# Patient Record
Sex: Female | Born: 1950 | Race: Black or African American | Hispanic: No | Marital: Single | Smoking: Former smoker
Health system: Southern US, Community
[De-identification: ages and names within clinical notes are randomized; demographics above are authoritative.]

## PROBLEM LIST (undated history)

## (undated) DIAGNOSIS — I10 Essential (primary) hypertension: Secondary | ICD-10-CM

## (undated) DIAGNOSIS — R220 Localized swelling, mass and lump, head: Secondary | ICD-10-CM

## (undated) DIAGNOSIS — L309 Dermatitis, unspecified: Secondary | ICD-10-CM

## (undated) DIAGNOSIS — I8390 Asymptomatic varicose veins of unspecified lower extremity: Secondary | ICD-10-CM

## (undated) DIAGNOSIS — R918 Other nonspecific abnormal finding of lung field: Secondary | ICD-10-CM

## (undated) HISTORY — DX: Asymptomatic varicose veins of unspecified lower extremity: I83.90

## (undated) HISTORY — PX: TUBAL LIGATION: SHX77

## (undated) HISTORY — DX: Other nonspecific abnormal finding of lung field: R91.8

## (undated) HISTORY — DX: Essential (primary) hypertension: I10

## (undated) HISTORY — DX: Localized swelling, mass and lump, head: R22.0

---

## 2001-07-02 ENCOUNTER — Ambulatory Visit (HOSPITAL_COMMUNITY): Admission: RE | Admit: 2001-07-02 | Discharge: 2001-07-02 | Payer: Self-pay | Admitting: Internal Medicine

## 2001-07-02 ENCOUNTER — Encounter: Payer: Self-pay | Admitting: Internal Medicine

## 2002-10-29 HISTORY — PX: HAMMER TOE SURGERY: SHX385

## 2003-03-03 ENCOUNTER — Ambulatory Visit (HOSPITAL_COMMUNITY): Admission: RE | Admit: 2003-03-03 | Discharge: 2003-03-03 | Payer: Self-pay | Admitting: Podiatry

## 2004-02-07 ENCOUNTER — Ambulatory Visit (HOSPITAL_COMMUNITY): Admission: RE | Admit: 2004-02-07 | Discharge: 2004-02-07 | Payer: Self-pay | Admitting: Internal Medicine

## 2005-05-11 ENCOUNTER — Ambulatory Visit (HOSPITAL_COMMUNITY): Admission: RE | Admit: 2005-05-11 | Discharge: 2005-05-11 | Payer: Self-pay | Admitting: Internal Medicine

## 2006-05-20 ENCOUNTER — Ambulatory Visit (HOSPITAL_COMMUNITY): Admission: RE | Admit: 2006-05-20 | Discharge: 2006-05-20 | Payer: Self-pay | Admitting: Internal Medicine

## 2006-11-17 ENCOUNTER — Emergency Department (HOSPITAL_COMMUNITY): Admission: EM | Admit: 2006-11-17 | Discharge: 2006-11-17 | Payer: Self-pay | Admitting: Emergency Medicine

## 2008-12-29 ENCOUNTER — Ambulatory Visit (HOSPITAL_COMMUNITY): Admission: RE | Admit: 2008-12-29 | Discharge: 2008-12-29 | Payer: Self-pay | Admitting: Internal Medicine

## 2010-02-06 ENCOUNTER — Ambulatory Visit (HOSPITAL_COMMUNITY): Admission: RE | Admit: 2010-02-06 | Discharge: 2010-02-06 | Payer: Self-pay | Admitting: Internal Medicine

## 2010-11-19 ENCOUNTER — Encounter: Payer: Self-pay | Admitting: Internal Medicine

## 2011-03-16 NOTE — H&P (Signed)
NAME:  Lisa Terrell, Lisa Terrell NO.:  [id number]   MEDICAL RECORD NO.:  [id number]                  PATIENT TYPE:   LOCATION:                                       FACILITY:   PHYSICIAN:  Denny Peon. Ulice Brilliant, D.P.M.               DATE OF BIRTH:   DATE OF ADMISSION:  03/03/2003  DATE OF DISCHARGE:                                HISTORY & PHYSICAL   HISTORY OF PRESENT ILLNESS:  The patient is a 60 year old, African-American  female who has a very painful hyperkeratotic lesion on the dorsal lateral  aspect of the fifth toe of her right foot.  This lesion has been present for  several years, but over the last several months it has gotten extremely  painful.  The patient relates that she has tried altering all different  types of shoes and it just does not seem like it goes away.   PAST MEDICAL HISTORY:  Unremarkable.  She has had one child.   MEDICATIONS:  No prescription medications at this point.   ALLERGIES:  No known drug allergies.   PAST SURGICAL HISTORY:  None.   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  She is a half pack per day smoker.  She denies use of alcohol.   PHYSICAL EXAMINATION:  She has a well-defined, hammer toe deformity of the  fifth digit of her right foot.  Radiographs reveal a fairly large prominence  on the lateral aspect of the proximal phalanx as well as an adductovarus  hammer toe rotation.   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   PLAN:  The patient has been treated conservatively with debridement.  She  relates that it helped for a period of time, but she is getting weary of  this and would like to have this corrected.  This will consist of a  derotational arthroplasty.  She has elected to do this under local  anesthesia.  We will do it at [facility] on Mar 03, 2003.  She has  read the consent form, apparently understood  and signed.  We have discussed  the procedure, the necessity of removing some bone from the toe and some  tendon, the usual postoperative course, the type of suture and closure that  we will use, two weeks in a dressing unable to get it wet for that two week  period.  She will likely be out of regular shoes for about five to six  weeks.  As stated, she has read this consent form and participated in the  discussion, apparently understood and signed.  Denny Peon. Ulice Brilliant, D.P.M.    CMD/MEDQ  D:  03/01/2003  T:  03/01/2003  Job:  [id number]

## 2011-03-16 NOTE — Op Note (Signed)
NAME:  Lisa Terrell, ZIESMER                         ACCOUNT NO.:  [id number]   MEDICAL RECORD NO.:  [id number]                   PATIENT TYPE:  AMB   LOCATION:  DAY                                  FACILITY:  [facility]   PHYSICIAN:  Cody M. Ulice Brilliant, D.P.M.               DATE OF BIRTH:  01/17/1951   DATE OF PROCEDURE:  03/03/2003  DATE OF DISCHARGE:                                 OPERATIVE REPORT   PREOPERATIVE DIAGNOSES:  Hammer toe deformity fifth toe of right foot.   POSTOPERATIVE DIAGNOSES:  Hammer toe deformity fifth toe of right foot.   PROCEDURE:  Derotation arthroplasty fifth digit right foot.   SURGEON:  Denny Peon. Ulice Brilliant, DPM   ANESTHESIA:  Local.   INDICATIONS FOR SURGERY:  A 60 year old black female with painful,  longstanding hammer digit of fifth toe with corresponding hyperkeratotic  lesion.  The patient has had problems with this for years, but recently it  has gotten worse and she has requested surgical correction.  Clinically the  toe lies in a position of adduction and varus.  Radiographically the  proximal phalangeal head is hypertrophic about the lateral aspect.   DESCRIPTION OF PROCEDURE:  The patient is brought into the OR and placed on  the table in the supine position.  Once positioned on the table, ethyl  chloride is utilized to topically refrigerate the skin and then a digital  block is performed about the fifth toe with a 1:1 mixture of lidocaine and  Marcaine.  A pneumatic ankle tourniquet is then applied across her right  ankle.  Her foot is then prepped and draped in the usual aseptic fashion.  An ACE bandage is then utilized to exsanguinate her foot.  The tourniquet is  inflated to 250 mmHg.   Procedure #1: DEROTATION ARTHROPLASTY FIFTH DIGIT OF RIGHT FOOT.  Attention  is directed to the this right fifth toe.  Two semi-elliptical, curvilinear,  skin incisions are planned with a skin marking pen; and these are then  carried out utilizing a fresh #15 skin  blade.  The resultant elliptical  wedge of skin is excised in toto.  This contains the hyperkeratotic lesion.  The skin edges are then undermined.  A proximal interphalangeal joint is  then entered by a dorsal transverse incision across the extensor tendon.  The medial and lateral collateral ligaments are isolate and severed.  The  proximal phalangeal head is then delivered into the surgical wound and  excised with the double-action bone forceps.  The newly formed surface is  inspected and any spicules that may be encountered are removed with the bone  rongeur.  The wound is flushed with copious amounts of irrigant.  Redundant  soft tissue is excised.  The skin edges are then reapproximated and closed  with a combination of simple interrupted and horizontal mattress sutures of  4-0 Prolene.   A postoperative injection of Hexadrol and  Marcaine is dispensed.  A Betadine  soaked Adaptic dressing and a dry sterile compressive dressing follows.  The  tourniquet is then deflated with tourniquet time extending 19 minutes.   The patient tolerates the anesthesia and procedure well.  She is transported  to day hospital without incident.  While there, a list of written  instructions are orally explained to her.  A first postoperative appointment  has been requested for one week.  She will be seen within a week for this  first postoperative visit.                                               Denny Peon. Ulice Brilliant, D.P.M.    CMD/MEDQ  D:  03/03/2003  T:  03/03/2003  Job:  [id number]

## 2011-03-19 ENCOUNTER — Other Ambulatory Visit (HOSPITAL_COMMUNITY): Payer: Self-pay | Admitting: Internal Medicine

## 2011-03-19 DIAGNOSIS — Z139 Encounter for screening, unspecified: Secondary | ICD-10-CM

## 2011-03-20 ENCOUNTER — Ambulatory Visit (HOSPITAL_COMMUNITY)
Admission: RE | Admit: 2011-03-20 | Discharge: 2011-03-20 | Disposition: A | Discharge status: Home / Self Care | Payer: 59 | Source: Ambulatory Visit | Attending: Internal Medicine | Admitting: Internal Medicine

## 2011-03-20 DIAGNOSIS — Z139 Encounter for screening, unspecified: Secondary | ICD-10-CM

## 2011-03-20 DIAGNOSIS — Z1231 Encounter for screening mammogram for malignant neoplasm of breast: Secondary | ICD-10-CM | POA: Insufficient documentation

## 2012-02-20 ENCOUNTER — Other Ambulatory Visit (HOSPITAL_COMMUNITY): Payer: Self-pay | Admitting: Internal Medicine

## 2012-02-20 DIAGNOSIS — Z139 Encounter for screening, unspecified: Secondary | ICD-10-CM

## 2012-03-20 ENCOUNTER — Ambulatory Visit (HOSPITAL_COMMUNITY)
Admission: RE | Admit: 2012-03-20 | Discharge: 2012-03-20 | Disposition: A | Discharge status: Home / Self Care | Payer: 59 | Source: Ambulatory Visit | Attending: Internal Medicine | Admitting: Internal Medicine

## 2012-03-20 DIAGNOSIS — Z1231 Encounter for screening mammogram for malignant neoplasm of breast: Secondary | ICD-10-CM | POA: Insufficient documentation

## 2012-03-20 DIAGNOSIS — Z139 Encounter for screening, unspecified: Secondary | ICD-10-CM

## 2012-03-24 ENCOUNTER — Ambulatory Visit (HOSPITAL_COMMUNITY): Payer: 59

## 2013-09-09 ENCOUNTER — Other Ambulatory Visit (HOSPITAL_COMMUNITY): Payer: Self-pay | Admitting: General Surgery

## 2013-09-09 DIAGNOSIS — R221 Localized swelling, mass and lump, neck: Secondary | ICD-10-CM

## 2013-09-16 ENCOUNTER — Ambulatory Visit (HOSPITAL_COMMUNITY)
Admission: RE | Admit: 2013-09-16 | Discharge: 2013-09-16 | Disposition: A | Discharge status: Home / Self Care | Payer: 59 | Source: Ambulatory Visit | Attending: General Surgery | Admitting: General Surgery

## 2013-09-16 ENCOUNTER — Encounter (HOSPITAL_COMMUNITY): Payer: Self-pay

## 2013-09-16 ENCOUNTER — Other Ambulatory Visit (HOSPITAL_COMMUNITY): Payer: Self-pay | Admitting: General Surgery

## 2013-09-16 DIAGNOSIS — F17200 Nicotine dependence, unspecified, uncomplicated: Secondary | ICD-10-CM | POA: Insufficient documentation

## 2013-09-16 DIAGNOSIS — R220 Localized swelling, mass and lump, head: Secondary | ICD-10-CM | POA: Insufficient documentation

## 2013-09-16 DIAGNOSIS — R599 Enlarged lymph nodes, unspecified: Secondary | ICD-10-CM | POA: Insufficient documentation

## 2013-09-16 DIAGNOSIS — R221 Localized swelling, mass and lump, neck: Secondary | ICD-10-CM

## 2013-09-16 DIAGNOSIS — K119 Disease of salivary gland, unspecified: Secondary | ICD-10-CM | POA: Insufficient documentation

## 2013-09-16 DIAGNOSIS — R222 Localized swelling, mass and lump, trunk: Secondary | ICD-10-CM | POA: Insufficient documentation

## 2013-09-16 MED ORDER — GADOBENATE DIMEGLUMINE 529 MG/ML IV SOLN
15.0000 mL | Freq: Once | INTRAVENOUS | Status: AC | PRN
  Administered 2013-09-16: 15 mL via INTRAVENOUS

## 2013-10-02 DIAGNOSIS — R220 Localized swelling, mass and lump, head: Secondary | ICD-10-CM

## 2013-10-02 HISTORY — DX: Localized swelling, mass and lump, head: R22.0

## 2013-10-07 ENCOUNTER — Other Ambulatory Visit (INDEPENDENT_AMBULATORY_CARE_PROVIDER_SITE_OTHER): Payer: Self-pay | Admitting: Otolaryngology

## 2013-10-07 DIAGNOSIS — R918 Other nonspecific abnormal finding of lung field: Secondary | ICD-10-CM

## 2013-10-15 ENCOUNTER — Ambulatory Visit (INDEPENDENT_AMBULATORY_CARE_PROVIDER_SITE_OTHER): Payer: 59 | Admitting: Otolaryngology

## 2013-10-15 ENCOUNTER — Ambulatory Visit (HOSPITAL_COMMUNITY)
Admission: RE | Admit: 2013-10-15 | Discharge: 2013-10-15 | Disposition: A | Discharge status: Home / Self Care | Payer: 59 | Source: Ambulatory Visit | Attending: Otolaryngology | Admitting: Otolaryngology

## 2013-10-15 DIAGNOSIS — R918 Other nonspecific abnormal finding of lung field: Secondary | ICD-10-CM

## 2013-10-28 ENCOUNTER — Encounter (HOSPITAL_COMMUNITY): Payer: 59

## 2013-11-04 ENCOUNTER — Ambulatory Visit (HOSPITAL_COMMUNITY)
Admission: RE | Admit: 2013-11-04 | Discharge: 2013-11-04 | Disposition: A | Discharge status: Home / Self Care | Payer: 59 | Source: Ambulatory Visit | Attending: Otolaryngology | Admitting: Otolaryngology

## 2013-11-04 DIAGNOSIS — R222 Localized swelling, mass and lump, trunk: Secondary | ICD-10-CM | POA: Insufficient documentation

## 2013-11-04 DIAGNOSIS — I700 Atherosclerosis of aorta: Secondary | ICD-10-CM | POA: Insufficient documentation

## 2013-11-04 DIAGNOSIS — R918 Other nonspecific abnormal finding of lung field: Secondary | ICD-10-CM | POA: Insufficient documentation

## 2013-11-04 DIAGNOSIS — K5730 Diverticulosis of large intestine without perforation or abscess without bleeding: Secondary | ICD-10-CM | POA: Insufficient documentation

## 2013-11-04 LAB — GLUCOSE, CAPILLARY: Glucose-Capillary: 80 mg/dL (ref 70–99)

## 2013-11-04 MED ORDER — FLUDEOXYGLUCOSE F - 18 (FDG) INJECTION
16.2000 | Freq: Once | INTRAVENOUS | Status: AC | PRN
  Administered 2013-11-04: 16.2 via INTRAVENOUS

## 2013-11-11 ENCOUNTER — Encounter: Payer: Self-pay | Admitting: *Deleted

## 2013-11-11 DIAGNOSIS — R918 Other nonspecific abnormal finding of lung field: Secondary | ICD-10-CM | POA: Insufficient documentation

## 2013-11-11 DIAGNOSIS — C3410 Malignant neoplasm of upper lobe, unspecified bronchus or lung: Secondary | ICD-10-CM | POA: Insufficient documentation

## 2013-11-11 DIAGNOSIS — I10 Essential (primary) hypertension: Secondary | ICD-10-CM | POA: Insufficient documentation

## 2013-11-12 ENCOUNTER — Institutional Professional Consult (permissible substitution) (INDEPENDENT_AMBULATORY_CARE_PROVIDER_SITE_OTHER): Payer: 59 | Admitting: Thoracic Surgery (Cardiothoracic Vascular Surgery)

## 2013-11-12 ENCOUNTER — Other Ambulatory Visit (HOSPITAL_COMMUNITY)
Admission: RE | Admit: 2013-11-12 | Discharge: 2013-11-12 | Disposition: A | Discharge status: Home / Self Care | Payer: 59 | Source: Ambulatory Visit | Attending: Otolaryngology | Admitting: Otolaryngology

## 2013-11-12 ENCOUNTER — Ambulatory Visit (INDEPENDENT_AMBULATORY_CARE_PROVIDER_SITE_OTHER): Payer: 59 | Admitting: Otolaryngology

## 2013-11-12 ENCOUNTER — Other Ambulatory Visit: Payer: Self-pay | Admitting: *Deleted

## 2013-11-12 ENCOUNTER — Encounter: Payer: Self-pay | Admitting: Thoracic Surgery (Cardiothoracic Vascular Surgery)

## 2013-11-12 VITALS — BP 140/84 | HR 65 | Resp 20 | Ht 66.0 in | Wt 172.0 lb

## 2013-11-12 DIAGNOSIS — R918 Other nonspecific abnormal finding of lung field: Secondary | ICD-10-CM

## 2013-11-12 DIAGNOSIS — D487 Neoplasm of uncertain behavior of other specified sites: Secondary | ICD-10-CM

## 2013-11-12 DIAGNOSIS — R222 Localized swelling, mass and lump, trunk: Secondary | ICD-10-CM

## 2013-11-12 DIAGNOSIS — R911 Solitary pulmonary nodule: Secondary | ICD-10-CM

## 2013-11-12 NOTE — Progress Notes (Signed)
PCP is Rosita Fire, MD Referring Provider is Rosita Fire, MD  Chief Complaint  Patient presents with  . Lung Mass    Surgical eval on left upper lobe lung mass, PET Scan 11/04/13    HPI: 63 year old woman presents with a chief complaint of a neck mass.  Lisa Terrell is a 63 year old smoker (1 pack a day for 30 years) who about 6 months ago noticed a "knot" in her neck. She wasn't having any pain, fever, chills, difficulty swallowing or shortness of breath. She didn't pay much attention to it for a while, however finally she became concerned and went to Dr. Legrand Rams. She eventually was referred to Dr. Romona Curls and then finally to Dr. Benjamine Mola. An MRI was done of the neck which showed a 2.8 cm left suprahilar lung mass with multiple level I neck nodes measuring 1.8 and 1.4 cm. There was a 1.5 cm left parotid mass. She was also noted to have a 2.8 cm suprahilar lung mass.  A PET CT was done which showed these lesions to be hypermetabolic.  She had an aspiration of a neck lymph node by Dr. Benjamine Mola today.  She continues to smoke a pack a day. She denies any chest pain, pressure, tightness, or shortness of breath. She has had a nonproductive cough but no hemoptysis. She denieswheezing. Her weight has been stable. She continues to work.  ECOG/ZUBROD= 0   Past Medical History  Diagnosis Date  . Hypertension   . Mass of submandibular region 10/02/13    TWO PALPABLE LEFT MASSES NOTED  . Mass of lung PER PET 10/29/13    LEFT UPPER LOBE  . Lung nodules 11/04/13 PER PET    MEDIAL LEFT UPPER LOBE  . Varicose veins     No past surgical history on file.  Family History  Problem Relation Age of Onset  . COPD Father   . Heart disease Father   . Hyperlipidemia Father   . Hypertension Father     Social History History  Substance Use Topics  . Smoking status: Current Every Day Smoker -- 1.00 packs/day for 30 years    Types: Cigarettes  . Smokeless tobacco: Not on file  . Alcohol Use: No    Current  Outpatient Prescriptions  Medication Sig Dispense Refill  . lisinopril-hydrochlorothiazide (PRINZIDE,ZESTORETIC) 20-25 MG per tablet Take 1 tablet by mouth daily.      . Prenatal Vit-Fe Fumarate-FA (M-VIT) tablet Take 1 tablet by mouth daily.       No current facility-administered medications for this visit.    No Known Allergies  Review of Systems  Constitutional: Negative for fever, chills, activity change, appetite change and unexpected weight change.  HENT: Negative for sore throat, trouble swallowing and voice change.        Palpable knot in neck and under chin  Respiratory: Positive for cough (Nonproductive). Negative for choking, chest tightness, shortness of breath and wheezing.   Cardiovascular: Negative for chest pain and leg swelling.       Varicose veins  All other systems reviewed and are negative.    BP 140/84  Pulse 65  Resp 20  Ht 5\' 6"  (1.676 m)  Wt 172 lb (78.019 kg)  BMI 27.77 kg/m2  SpO2 98% Physical Exam  Vitals reviewed. Constitutional: She is oriented to person, place, and time. She appears well-developed and well-nourished. No distress.  HENT:  Head: Normocephalic and atraumatic.  Eyes: EOM are normal. Pupils are equal, round, and reactive to light.  Neck: Neck supple.  No thyromegaly present.  Cardiovascular: Normal rate, regular rhythm, normal heart sounds and intact distal pulses.  Exam reveals no gallop and no friction rub.   No murmur heard. Pulmonary/Chest: Effort normal and breath sounds normal. She has no wheezes. She has no rales.  Abdominal: Soft. There is no tenderness.  Musculoskeletal: She exhibits no edema.  Lymphadenopathy:    She has cervical adenopathy (palpable left submandibular mass and  left-sided  cervical nodes, no supraclavicular adenopathy).  Neurological: She is alert and oriented to person, place, and time. No cranial nerve deficit.  No focal weakness  Skin: Skin is warm and dry.     Diagnostic Tests: CHEST 2 VIEW   COMPARISON: None.  FINDINGS:  Cardiac silhouette appears normal. 3 cm left suprahilar mass is  noted concerning for malignancy; chest CT is recommended for further  evaluation. No pneumothorax or pleural effusion is noted. Bony  thorax appears intact.  IMPRESSION:  3 cm left suprahilar mass is noted concerning for malignancy ; chest  CT scan with contrast is recommended for further evaluation.  Electronically Signed  By: Sabino Dick M.D.  On: 09/16/2013 08:19  MR NECK SOFT TISSUE ONLY WITHOUT AND WITH CONTRAST  TECHNIQUE:  Multiplanar, multisequence MR imaging was performed both before and  after administration of intravenous contrast.  CONTRAST: 54mL MULTIHANCE GADOBENATE DIMEGLUMINE 529 MG/ML IV SOLN  COMPARISON: 09/16/2013 chest x-ray.  FINDINGS:  There is a mass within the left suprahilar region highly concerning  for malignancy measuring up to 2.8 cm and incompletely assessed on  the present exam. PET CT may be considered for further delineation  (to include the head and neck region for additional evaluation of  below described findings).  Left level 1 adenopathy with lymph nodes measuring up to 1.8 x 1.8 x  1.8 cm and 1.4 x 1.2 x 1.2 cm. This raises possibility of primary  malignancy of the head and neck region. Besides the below described  left parotid mass/adenopathy, no primary neck mass is identified and  panendoscopy recommended to exclude mucosal abnormality not detected  by MR.  Anterior margin of the left parotid superficial lobe 1.5 x 1.3 x 0.9  cm lesion. Question primary of parotid mass versus intra parotid  adenopathy. ENT consultation may be considered.  Cervical spondylotic changes with spinal stenosis and cord  flattening most prominent C4-5 and less so at the C3-4 and C5-6  level.  Posterior to T1 level within the subcutaneous region there is a 1 cm  fluid appearing structure with surrounding inflammation. It is  possible this represents an inflamed  sebaceous cyst. Metastatic  deposit not excluded.  Partial opacification left mastoid air cells. No definitive mass  identified at the level of drainage of the left eustachian tube.  Visualized intracranial structures without evidence of enhancing  lesion.  Mucosal thickening maxillary sinuses.  IMPRESSION:  2.8 cm left suprahilar region highly concerning for malignancy and  incompletely assessed on the present exam. PET CT may be considered  for further delineation (to include the head and neck region for  additional evaluation of below described findings).  Left level 1 adenopathy. This raises possibility of primary  malignancy of the head and neck region. Besides the below described  left parotid mass/adenopathy, no primary neck mass is identified and  panendoscopy recommended to exclude mucosal abnormality not detected  by MR.  Prominence of the soft palate without discrete mass identified.  Anterior margin of the left parotid superficial lobe 1.5 x 1.3 x 0.9  cm lesion. Question primary of parotid mass versus intra parotid  adenopathy. ENT consultation may be considered.  Cervical spondylotic changes with spinal stenosis and cord  flattening most prominent C4-5 and less so at the C3-4 and C5-6  level.  Posterior to T1 level within the subcutaneous region there is a 1 cm  fluid appearing structure with surrounding inflammation. It is  possible this represents an inflamed sebaceous cyst. Metastatic  deposit not excluded.  Partial opacification left mastoid air cells. No definitive mass  identified at the level of drainage of the left eustachian tube.  These results were called by telephone at the time of interpretation  on 09/16/2013 at 9:33 AM to [facility], Dr. Scherry Ran nurse as  Dr. Romona Curls was in the OR, who verbally acknowledged these results.  Electronically Signed  By: Chauncey Cruel M.D.  On: 09/16/2013 09:55   NUCLEAR MEDICINE PET SKULL BASE TO THIGH  FASTING  BLOOD GLUCOSE: Value: 80 mg/dl  TECHNIQUE:  16.2 mCi F-18 FDG was injected intravenously. CT data was obtained  and used for attenuation correction and anatomic localization only.  (This was not acquired as a diagnostic CT examination.) Additional  exam technical data entered on technologist worksheet.  COMPARISON: MRI neck dated 09/16/2013  FINDINGS:  NECK  12 mm short axis left submandibular (level IB) node, max SUV 8.8  (neck PET image 63). Adjacent 17 mm short axis left level IB node  (series 2/ image 47), max SUV 13.0 (neck PET image 67).  These findings are grossly unchanged from prior MRI and remain  worrisome for nodal metastases.  CHEST  Macrolobulated 2.8 x 3.1 cm left upper lobe mass (series 2/image  70), corresponding to the prior MRI abnormality, max SUV 15.1. This  appearance is highly suspicious for primary bronchogenic neoplasm.  Two additional satellite nodules in the medial left upper lobe  measuring up to 8 mm (series 2/ images 61 and 64), max SUV 2.5.  Small mediastinal and bilateral axillary nodes measuring up to 8 mm  short axis, non FDG avid.  ABDOMEN/PELVIS  No abnormal hypermetabolic activity within the liver, pancreas,  adrenal glands, or spleen.  Atherosclerotic calcifications of the abdominal aorta and branch  vessels. Colonic diverticulosis. Left colon is mildly thick-walled  but decompressed.  8 mm short axis right inguinal node (series 2/ image 196), max SUV  6.2.  SKELETON  No focal hypermetabolic activity to suggest skeletal metastasis.  IMPRESSION:  3.1 cm left upper lobe mass, corresponding to the prior MRI  abnormality, max SUV 15.1. This appearance is highly suspicious for  primary bronchogenic neoplasm.  Two satellite nodules in the medial left upper lobe measuring up to  8 mm, max SUV 2.5, suspicious for metastases.  Two left submandibular nodes 1.7 cm short axis, max SUV 13.0. This  appearance is suspicious for nodal metastases, although  possibly  related to a nonvisualized primary head/neck cancer. ENT  consultation with visual inspection is suggested.  8 mm short axis right inguinal node, max SUV 6.2. Although the  distribution is unexpected, given the hypermetabolism, this  appearance is suspicious for nodal metastasis related to primary  lung or head/neck cancer.  Electronically Signed  By: Julian Hy M.D.  On: 11/04/2013 15:24  Impression: 63 year old smoker with a left upper lobe mass that is markedly hypermetabolic by PET CT. There are multiple other findings including satellite nodules within the left upper lobe, which would make this a T3 lesion. She also has cervical and submandibular nodes as well  as a parotid mass which are hypermetabolic. Finally there is a small right inguinal node that is hypermetabolic as well.  The first question that needs to be answered is whether we have a single metastatic lung cancer, or a lung cancer and a synchronous head and neck primary. I suspected it is the latter. Then we need to try to determine the stage and prognosis of each of those lesions.   She had a cervical node aspiration by Dr. Benjamine Mola today, we will await the results of that biopsy.  In the meantime I recommended to her that we proceed with electromagnetic navigational bronchoscopy and endobronchial ultrasound for diagnosis and staging of the lung mass. I described the procedure to her. She understands it is an endoscopic procedure to be done in the operating room under general anesthesia. We will do a guided bronchoscopic sampling of the tumor in the left upper lobe and then use EBUS to sample the mediastinal and hilar lymph nodes. We discussed the risks including those related to general anesthesia. She understands the risks of bleeding, pneumothorax, and false negative biopsies. She accepts the risks and agrees to proceed.  She will need a CT using super dimension protocol for the ENB mapping.  We will also obtain  PFTs.  Plan: ENB and EBUS on Thursday 11/19/2013

## 2013-11-17 ENCOUNTER — Ambulatory Visit (HOSPITAL_COMMUNITY)
Admission: RE | Admit: 2013-11-17 | Discharge: 2013-11-17 | Disposition: A | Discharge status: Home / Self Care | Payer: 59 | Source: Ambulatory Visit | Attending: Thoracic Surgery (Cardiothoracic Vascular Surgery) | Admitting: Thoracic Surgery (Cardiothoracic Vascular Surgery)

## 2013-11-17 DIAGNOSIS — R911 Solitary pulmonary nodule: Secondary | ICD-10-CM

## 2013-11-17 DIAGNOSIS — F17200 Nicotine dependence, unspecified, uncomplicated: Secondary | ICD-10-CM | POA: Insufficient documentation

## 2013-11-17 DIAGNOSIS — R222 Localized swelling, mass and lump, trunk: Secondary | ICD-10-CM | POA: Insufficient documentation

## 2013-11-17 DIAGNOSIS — R918 Other nonspecific abnormal finding of lung field: Secondary | ICD-10-CM | POA: Insufficient documentation

## 2013-11-17 LAB — PULMONARY FUNCTION TEST
DL/VA % pred: 108 %
DL/VA: 5.35 ml/min/mmHg/L
DLCO cor % pred: 81 %
DLCO cor: 20.81 ml/min/mmHg
DLCO unc % pred: 81 %
DLCO unc: 20.81 ml/min/mmHg
FEF 25-75 Post: 2.37 L/s
FEF 25-75 Pre: 1.71 L/s
FEF2575-%Change-Post: 38 %
FEF2575-%Pred-Post: 114 %
FEF2575-%Pred-Pre: 82 %
FEV1-%Change-Post: 8 %
FEV1-%Pred-Post: 100 %
FEV1-%Pred-Pre: 92 %
FEV1-Post: 2.13 L
FEV1-Pre: 1.96 L
FEV1FVC-%Change-Post: 1 %
FEV1FVC-%Pred-Pre: 99 %
FEV6-%Change-Post: 9 %
FEV6-%Pred-Post: 103 %
FEV6-%Pred-Pre: 94 %
FEV6-Post: 2.7 L
FEV6-Pre: 2.47 L
FEV6FVC-%Change-Post: 1 %
FEV6FVC-%Pred-Post: 104 %
FEV6FVC-%Pred-Pre: 102 %
FVC-%Change-Post: 7 %
FVC-%Pred-Post: 99 %
FVC-%Pred-Pre: 92 %
FVC-Post: 2.7 L
FVC-Pre: 2.51 L
Post FEV1/FVC ratio: 79 %
Post FEV6/FVC ratio: 100 %
Pre FEV1/FVC ratio: 78 %
Pre FEV6/FVC Ratio: 99 %
RV % pred: 116 %
RV: 2.42 L
TLC % pred: 94 %
TLC: 4.93 L

## 2013-11-17 LAB — BLOOD GAS, ARTERIAL
ACID-BASE EXCESS: 1.7 mmol/L (ref 0.0–2.0)
Bicarbonate: 25.7 mEq/L — ABNORMAL HIGH (ref 20.0–24.0)
DRAWN BY: 242311
FIO2: 0.21 %
O2 SAT: 96.9 %
PCO2 ART: 40.1 mmHg (ref 35.0–45.0)
Patient temperature: 98.6
TCO2: 27 mmol/L (ref 0–100)
pH, Arterial: 7.424 (ref 7.350–7.450)
pO2, Arterial: 85.9 mmHg (ref 80.0–100.0)

## 2013-11-17 MED ORDER — ALBUTEROL SULFATE (2.5 MG/3ML) 0.083% IN NEBU
2.5000 mg | INHALATION_SOLUTION | Freq: Once | RESPIRATORY_TRACT | Status: AC
  Administered 2013-11-17: 2.5 mg via RESPIRATORY_TRACT

## 2013-11-18 ENCOUNTER — Encounter (HOSPITAL_COMMUNITY)
Admission: RE | Admit: 2013-11-18 | Discharge: 2013-11-18 | Disposition: A | Discharge status: Home / Self Care | Payer: 59 | Source: Ambulatory Visit | Attending: Thoracic Surgery (Cardiothoracic Vascular Surgery) | Admitting: Thoracic Surgery (Cardiothoracic Vascular Surgery)

## 2013-11-18 ENCOUNTER — Encounter (HOSPITAL_COMMUNITY): Payer: Self-pay | Admitting: Pharmacy Technician

## 2013-11-18 ENCOUNTER — Ambulatory Visit (HOSPITAL_COMMUNITY)
Admission: RE | Admit: 2013-11-18 | Discharge: 2013-11-18 | Disposition: A | Discharge status: Home / Self Care | Payer: 59 | Source: Ambulatory Visit | Attending: Thoracic Surgery (Cardiothoracic Vascular Surgery) | Admitting: Thoracic Surgery (Cardiothoracic Vascular Surgery)

## 2013-11-18 ENCOUNTER — Encounter (HOSPITAL_COMMUNITY): Payer: Self-pay

## 2013-11-18 VITALS — BP 130/88 | HR 58 | Temp 98.7°F | Resp 20 | Ht 66.0 in | Wt 169.4 lb

## 2013-11-18 DIAGNOSIS — R918 Other nonspecific abnormal finding of lung field: Secondary | ICD-10-CM

## 2013-11-18 LAB — COMPREHENSIVE METABOLIC PANEL
ALT: 18 U/L (ref 0–35)
AST: 24 U/L (ref 0–37)
Albumin: 3.8 g/dL (ref 3.5–5.2)
Alkaline Phosphatase: 66 U/L (ref 39–117)
BILIRUBIN TOTAL: 0.5 mg/dL (ref 0.3–1.2)
BUN: 17 mg/dL (ref 6–23)
CHLORIDE: 104 meq/L (ref 96–112)
CO2: 24 meq/L (ref 19–32)
Calcium: 9.1 mg/dL (ref 8.4–10.5)
Creatinine, Ser: 0.98 mg/dL (ref 0.50–1.10)
GFR, EST AFRICAN AMERICAN: 70 mL/min — AB (ref >90)
GFR, EST NON AFRICAN AMERICAN: 61 mL/min — AB (ref >90)
GLUCOSE: 93 mg/dL (ref 70–99)
Potassium: 3.8 mEq/L (ref 3.7–5.3)
SODIUM: 141 meq/L (ref 137–147)
Total Protein: 7.1 g/dL (ref 6.0–8.3)

## 2013-11-18 LAB — CBC
HCT: 38.4 % (ref 36.0–46.0)
HEMOGLOBIN: 12.9 g/dL (ref 12.0–15.0)
MCH: 30.4 pg (ref 26.0–34.0)
MCHC: 33.6 g/dL (ref 30.0–36.0)
MCV: 90.6 fL (ref 78.0–100.0)
PLATELETS: 176 10*3/uL (ref 150–400)
RBC: 4.24 MIL/uL (ref 3.87–5.11)
RDW: 13.3 % (ref 11.5–15.5)
WBC: 4.1 10*3/uL (ref 4.0–10.5)

## 2013-11-18 LAB — APTT: aPTT: 29 seconds (ref 24–37)

## 2013-11-18 LAB — PROTIME-INR
INR: 1.08 (ref 0.00–1.49)
Prothrombin Time: 13.8 seconds (ref 11.6–15.2)

## 2013-11-18 NOTE — Pre-Procedure Instructions (Addendum)
DEIRDRE GRYDER  11/18/2013   Your procedure is scheduled on:  11/19/13  Report to [facility] short stay admitting at 58 AM.  Call this number if you have problems the morning of surgery: [phone]   Remember:   Do not eat food or drink liquids after midnight.   Take these medicines the morning of surgery with A SIP OF WATER:none          STOP all herbel meds, nsaids (aleve,naproxen,advil,ibuprofen) including aspirin, vitamins   Do not wear jewelry, make-up or nail polish.  Do not wear lotions, powders, or perfumes. You may wear deodorant.  Do not shave 48 hours prior to surgery. Men may shave face and neck.  Do not bring valuables to the hospital.  [facility] is not responsible                  for any belongings or valuables.               Contacts, dentures or bridgework may not be worn into surgery.  Leave suitcase in the car. After surgery it may be brought to your room.  For patients admitted to the hospital, discharge time is determined by your                treatment team.               Patients discharged the day of surgery will not be allowed to drive  home.  Name and phone number of your driver:   Special Instructions: Shower using CHG 2 nights before surgery and the night before surgery.  If you shower the day of surgery use CHG.  Use special wash - you have one bottle of CHG for all showers.  You should use approximately 1/3 of the bottle for each shower.   Please read over the following fact sheets that you were given: Pain Booklet, Coughing and Deep Breathing and Surgical Site Infection Prevention

## 2013-11-19 ENCOUNTER — Encounter (HOSPITAL_COMMUNITY): Payer: 59 | Admitting: Anesthesiology

## 2013-11-19 ENCOUNTER — Encounter (HOSPITAL_COMMUNITY)
Admission: RE | Disposition: A | Discharge status: Home / Self Care | Payer: Self-pay | Source: Ambulatory Visit | Attending: Thoracic Surgery (Cardiothoracic Vascular Surgery)

## 2013-11-19 ENCOUNTER — Ambulatory Visit (HOSPITAL_COMMUNITY): Payer: 59

## 2013-11-19 ENCOUNTER — Encounter (HOSPITAL_COMMUNITY): Payer: Self-pay | Admitting: *Deleted

## 2013-11-19 ENCOUNTER — Ambulatory Visit (HOSPITAL_COMMUNITY)
Admission: RE | Admit: 2013-11-19 | Discharge: 2013-11-19 | Disposition: A | Discharge status: Home / Self Care | Payer: 59 | Source: Ambulatory Visit | Attending: Thoracic Surgery (Cardiothoracic Vascular Surgery) | Admitting: Thoracic Surgery (Cardiothoracic Vascular Surgery)

## 2013-11-19 ENCOUNTER — Ambulatory Visit (HOSPITAL_COMMUNITY): Payer: 59 | Admitting: Anesthesiology

## 2013-11-19 DIAGNOSIS — C3410 Malignant neoplasm of upper lobe, unspecified bronchus or lung: Secondary | ICD-10-CM

## 2013-11-19 DIAGNOSIS — R918 Other nonspecific abnormal finding of lung field: Secondary | ICD-10-CM

## 2013-11-19 DIAGNOSIS — F17200 Nicotine dependence, unspecified, uncomplicated: Secondary | ICD-10-CM | POA: Insufficient documentation

## 2013-11-19 DIAGNOSIS — K119 Disease of salivary gland, unspecified: Secondary | ICD-10-CM | POA: Insufficient documentation

## 2013-11-19 DIAGNOSIS — I83893 Varicose veins of bilateral lower extremities with other complications: Secondary | ICD-10-CM | POA: Insufficient documentation

## 2013-11-19 DIAGNOSIS — I10 Essential (primary) hypertension: Secondary | ICD-10-CM | POA: Insufficient documentation

## 2013-11-19 HISTORY — PX: ENDOBRONCHIAL ULTRASOUND: SHX5096

## 2013-11-19 HISTORY — PX: VIDEO BRONCHOSCOPY WITH ENDOBRONCHIAL NAVIGATION: SHX6175

## 2013-11-19 SURGERY — VIDEO BRONCHOSCOPY WITH ENDOBRONCHIAL NAVIGATION
Anesthesia: General

## 2013-11-19 MED ORDER — NEOSTIGMINE METHYLSULFATE 1 MG/ML IJ SOLN
INTRAMUSCULAR | Status: AC
  Filled 2013-11-19: qty 10

## 2013-11-19 MED ORDER — ARTIFICIAL TEARS OP OINT
TOPICAL_OINTMENT | OPHTHALMIC | Status: AC
  Filled 2013-11-19: qty 3.5

## 2013-11-19 MED ORDER — ONDANSETRON HCL 4 MG/2ML IJ SOLN
INTRAMUSCULAR | Status: DC | PRN
  Administered 2013-11-19: 4 mg via INTRAVENOUS

## 2013-11-19 MED ORDER — FENTANYL CITRATE 0.05 MG/ML IJ SOLN
INTRAMUSCULAR | Status: DC | PRN
  Administered 2013-11-19 (×3): 50 ug via INTRAVENOUS

## 2013-11-19 MED ORDER — MIDAZOLAM HCL 5 MG/5ML IJ SOLN
INTRAMUSCULAR | Status: DC | PRN
  Administered 2013-11-19 (×2): 1 mg via INTRAVENOUS

## 2013-11-19 MED ORDER — ROCURONIUM BROMIDE 50 MG/5ML IV SOLN
INTRAVENOUS | Status: AC
  Filled 2013-11-19: qty 1

## 2013-11-19 MED ORDER — PROPOFOL 10 MG/ML IV BOLUS
INTRAVENOUS | Status: DC | PRN
  Administered 2013-11-19: 100 mg via INTRAVENOUS

## 2013-11-19 MED ORDER — LIDOCAINE HCL (CARDIAC) 20 MG/ML IV SOLN
INTRAVENOUS | Status: AC
  Filled 2013-11-19: qty 5

## 2013-11-19 MED ORDER — FENTANYL CITRATE 0.05 MG/ML IJ SOLN: 25.0000 ug | INTRAMUSCULAR | Status: DC | PRN

## 2013-11-19 MED ORDER — 0.9 % SODIUM CHLORIDE (POUR BTL) OPTIME
TOPICAL | Status: DC | PRN
  Administered 2013-11-19: 1000 mL

## 2013-11-19 MED ORDER — FENTANYL CITRATE 0.05 MG/ML IJ SOLN
INTRAMUSCULAR | Status: AC
  Filled 2013-11-19: qty 5

## 2013-11-19 MED ORDER — OXYCODONE HCL 5 MG/5ML PO SOLN: 5.0000 mg | Freq: Once | ORAL | Status: DC | PRN

## 2013-11-19 MED ORDER — PROPOFOL 10 MG/ML IV BOLUS
INTRAVENOUS | Status: AC
  Filled 2013-11-19: qty 20

## 2013-11-19 MED ORDER — MEPERIDINE HCL 25 MG/ML IJ SOLN: 6.2500 mg | INTRAMUSCULAR | Status: DC | PRN

## 2013-11-19 MED ORDER — ROCURONIUM BROMIDE 100 MG/10ML IV SOLN
INTRAVENOUS | Status: DC | PRN
  Administered 2013-11-19 (×2): 10 mg via INTRAVENOUS
  Administered 2013-11-19: 40 mg via INTRAVENOUS

## 2013-11-19 MED ORDER — EPINEPHRINE HCL 1 MG/ML IJ SOLN
INTRAMUSCULAR | Status: AC
  Filled 2013-11-19: qty 1

## 2013-11-19 MED ORDER — LACTATED RINGERS IV SOLN
INTRAVENOUS | Status: DC | PRN
  Administered 2013-11-19: 11:00:00 via INTRAVENOUS

## 2013-11-19 MED ORDER — MIDAZOLAM HCL 2 MG/2ML IJ SOLN: 0.5000 mg | Freq: Once | INTRAMUSCULAR | Status: DC | PRN

## 2013-11-19 MED ORDER — MIDAZOLAM HCL 2 MG/2ML IJ SOLN
INTRAMUSCULAR | Status: AC
  Filled 2013-11-19: qty 2

## 2013-11-19 MED ORDER — LACTATED RINGERS IV SOLN
INTRAVENOUS | Status: DC
  Administered 2013-11-19: 07:00:00 via INTRAVENOUS

## 2013-11-19 MED ORDER — OXYCODONE HCL 5 MG PO TABS: 5.0000 mg | ORAL_TABLET | Freq: Once | ORAL | Status: DC | PRN

## 2013-11-19 MED ORDER — ARTIFICIAL TEARS OP OINT
TOPICAL_OINTMENT | OPHTHALMIC | Status: DC | PRN
  Administered 2013-11-19: 1 via OPHTHALMIC

## 2013-11-19 MED ORDER — GLYCOPYRROLATE 0.2 MG/ML IJ SOLN
INTRAMUSCULAR | Status: DC | PRN
  Administered 2013-11-19: 0.6 mg via INTRAVENOUS

## 2013-11-19 MED ORDER — NEOSTIGMINE METHYLSULFATE 1 MG/ML IJ SOLN
INTRAMUSCULAR | Status: DC | PRN
  Administered 2013-11-19: 4 mg via INTRAVENOUS

## 2013-11-19 MED ORDER — LIDOCAINE HCL (CARDIAC) 20 MG/ML IV SOLN
INTRAVENOUS | Status: DC | PRN
  Administered 2013-11-19: 20 mg via INTRAVENOUS

## 2013-11-19 MED ORDER — PROMETHAZINE HCL 25 MG/ML IJ SOLN: 6.2500 mg | INTRAMUSCULAR | Status: DC | PRN

## 2013-11-19 MED ORDER — GLYCOPYRROLATE 0.2 MG/ML IJ SOLN
INTRAMUSCULAR | Status: AC
  Filled 2013-11-19: qty 3

## 2013-11-19 SURGICAL SUPPLY — 33 items
BRUSH SUPERTRAX BIOPSY (INSTRUMENTS) IMPLANT
BRUSH SUPERTRAX NDL-TIP CYTO (INSTRUMENTS) IMPLANT
CANISTER SUCTION 2500CC (MISCELLANEOUS) ×3 IMPLANT
CHANNEL WORK EXTEND EDGE 180 (KITS) IMPLANT
CHANNEL WORK EXTEND EDGE 45 (KITS) IMPLANT
CHANNEL WORK EXTEND EDGE 90 (KITS) IMPLANT
CONT SPEC 4OZ CLIKSEAL STRL BL (MISCELLANEOUS) ×6 IMPLANT
COVER TABLE BACK 60X90 (DRAPES) ×3 IMPLANT
FILTER STRAW FLUID ASPIR (MISCELLANEOUS) IMPLANT
FORCEPS BIOP SUPERTRX PREMAR (INSTRUMENTS) IMPLANT
GLOVE SURG SIGNA 7.5 PF LTX (GLOVE) ×3 IMPLANT
GLOVE SURG SS PI 6.5 STRL IVOR (GLOVE) ×3 IMPLANT
GOWN PREVENTION PLUS XLARGE (GOWN DISPOSABLE) ×3 IMPLANT
KIT PROCEDURE EDGE 180 (KITS) ×3 IMPLANT
KIT PROCEDURE EDGE 45 (KITS) IMPLANT
KIT PROCEDURE EDGE 90 (KITS) IMPLANT
KIT ROOM TURNOVER OR (KITS) ×3 IMPLANT
MARKER SKIN DUAL TIP RULER LAB (MISCELLANEOUS) ×3 IMPLANT
NEEDLE SUPERTRX PREMARK BIOPSY (NEEDLE) IMPLANT
NEEDLE SYS SONOTIP II EBUSTBNA (NEEDLE) ×3 IMPLANT
NS IRRIG 1000ML POUR BTL (IV SOLUTION) ×3 IMPLANT
OIL SILICONE PENTAX (PARTS (SERVICE/REPAIRS)) ×3 IMPLANT
PAD ARMBOARD 7.5X6 YLW CONV (MISCELLANEOUS) ×6 IMPLANT
PATCHES PATIENT (LABEL) ×9 IMPLANT
SPONGE GAUZE 4X4 12PLY (GAUZE/BANDAGES/DRESSINGS) ×3 IMPLANT
SYR 20CC LL (SYRINGE) ×3 IMPLANT
SYR 20ML ECCENTRIC (SYRINGE) ×3 IMPLANT
SYR 30ML LL (SYRINGE) ×3 IMPLANT
SYR 5ML LL (SYRINGE) ×3 IMPLANT
TOWEL OR 17X24 6PK STRL BLUE (TOWEL DISPOSABLE) ×3 IMPLANT
TRAP SPECIMEN MUCOUS 40CC (MISCELLANEOUS) ×3 IMPLANT
TUBE CONNECTING 12'X1/4 (SUCTIONS) ×2
TUBE CONNECTING 12X1/4 (SUCTIONS) ×4 IMPLANT

## 2013-11-19 NOTE — Brief Op Note (Signed)
11/19/2013  1:24 PM  PATIENT:  Lisa Terrell  63 y.o. female  PRE-OPERATIVE DIAGNOSIS:  LUL MASS  POST-OPERATIVE DIAGNOSIS:  left upper lobe mass  PROCEDURE:  Procedure(s): VIDEO BRONCHOSCOPY WITH ENDOBRONCHIAL NAVIGATION (N/A) ENDOBRONCHIAL ULTRASOUND (N/A) Brushings, biopsies and lymph node aspirations  SURGEON:  Surgeon(s) and Role:    * Melrose Nakayama, MD - Primary   ANESTHESIA:   general  EBL:     BLOOD ADMINISTERED:none  DRAINS: none   LOCAL MEDICATIONS USED:  NONE  SPECIMEN:  Source of Specimen:  LUL mass, lymph nodes  DISPOSITION OF SPECIMEN:  PATHOLOGY  PLAN OF CARE: Discharge to home after PACU  PATIENT DISPOSITION:  PACU - hemodynamically stable.   Delay start of Pharmacological VTE agent (>24hrs) due to surgical blood loss or risk of bleeding: not applicable   FINDINGS: LUL mass - malignant unable to determine primary v met on quick prep

## 2013-11-19 NOTE — Anesthesia Postprocedure Evaluation (Signed)
  Anesthesia Post-op Note  Patient: Lisa Terrell  Procedure(s) Performed: Procedure(s): VIDEO BRONCHOSCOPY WITH ENDOBRONCHIAL NAVIGATION (N/A) ENDOBRONCHIAL ULTRASOUND (N/A)  Patient Location: PACU  Anesthesia Type:General  Level of Consciousness: awake, alert , oriented and patient cooperative  Airway and Oxygen Therapy: Patient Spontanous Breathing and Patient connected to nasal cannula oxygen  Post-op Pain: none  Post-op Assessment: Post-op Vital signs reviewed, Patient's Cardiovascular Status Stable, Respiratory Function Stable, Patent Airway, No signs of Nausea or vomiting and Pain level controlled  Post-op Vital Signs: Reviewed and stable  Complications: No apparent anesthesia complications

## 2013-11-19 NOTE — Anesthesia Procedure Notes (Signed)
Procedure Name: Intubation Date/Time: 11/19/2013 11:00 AM Performed by: Erik Obey Pre-anesthesia Checklist: Patient identified, Timeout performed, Emergency Drugs available, Suction available and Patient being monitored Patient Re-evaluated:Patient Re-evaluated prior to inductionOxygen Delivery Method: Circle system utilized Preoxygenation: Pre-oxygenation with 100% oxygen Intubation Type: IV induction Ventilation: Mask ventilation without difficulty and Oral airway inserted - appropriate to patient size Laryngoscope Size: Mac and 3 Grade View: Grade I Tube type: Oral Tube size: 8.5 mm Number of attempts: 1 Airway Equipment and Method: Stylet Placement Confirmation: ETT inserted through vocal cords under direct vision,  positive ETCO2 and breath sounds checked- equal and bilateral Secured at: 22 cm Tube secured with: Tape Dental Injury: Teeth and Oropharynx as per pre-operative assessment

## 2013-11-19 NOTE — Anesthesia Preprocedure Evaluation (Signed)
Anesthesia Evaluation  Patient identified by MRN, date of birth, ID band Patient awake    Reviewed: Allergy & Precautions, H&P , NPO status , Patient's Chart, lab work & pertinent test results  History of Anesthesia Complications Negative for: history of anesthetic complications  Airway Mallampati: II TM Distance: >3 FB Neck ROM: Full    Dental  (+) Dental Advisory Given and Teeth Intact   Pulmonary Current Smoker,  Submandibular lymph node enlargement, lung nodule breath sounds clear to auscultation        Cardiovascular hypertension, Pt. on medications Rhythm:Regular Rate:Normal     Neuro/Psych negative neurological ROS     GI/Hepatic negative GI ROS, Neg liver ROS,   Endo/Other  negative endocrine ROS  Renal/GU negative Renal ROS     Musculoskeletal   Abdominal   Peds  Hematology negative hematology ROS (+)   Anesthesia Other Findings   Reproductive/Obstetrics                           Anesthesia Physical Anesthesia Plan  ASA: III  Anesthesia Plan: General   Post-op Pain Management:    Induction: Intravenous  Airway Management Planned:   Additional Equipment:   Intra-op Plan:   Post-operative Plan: Extubation in OR  Informed Consent: I have reviewed the patients History and Physical, chart, labs and discussed the procedure including the risks, benefits and alternatives for the proposed anesthesia with the patient or authorized representative who has indicated his/her understanding and acceptance.     Plan Discussed with: CRNA and Surgeon  Anesthesia Plan Comments: (Plan routine monitors, GETA)        Anesthesia Quick Evaluation

## 2013-11-19 NOTE — H&P (View-Only) (Signed)
PCP is Rosita Fire, MD Referring Provider is Rosita Fire, MD  Chief Complaint  Patient presents with  . Lung Mass    Surgical eval on left upper lobe lung mass, PET Scan 11/04/13    HPI: 63 year old woman presents with a chief complaint of a neck mass.  Lisa Terrell is a 63 year old smoker (1 pack a day for 30 years) who about 6 months ago noticed a "knot" in Lisa Terrell neck. Lisa Terrell wasn't having any pain, fever, chills, difficulty swallowing or shortness of breath. Lisa Terrell didn't pay much attention to it for a while, however finally Lisa Terrell became concerned and went to Dr. Legrand Rams. Lisa Terrell eventually was referred to Dr. Romona Curls and then finally to Dr. Benjamine Mola. An MRI was done of the neck which showed a 2.8 cm left suprahilar lung mass with multiple level I neck nodes measuring 1.8 and 1.4 cm. There was a 1.5 cm left parotid mass. Lisa Terrell was also noted to have a 2.8 cm suprahilar lung mass.  A PET CT was done which showed these lesions to be hypermetabolic.  Lisa Terrell had an aspiration of a neck lymph node by Dr. Benjamine Mola today.  Lisa Terrell continues to smoke a pack a day. Lisa Terrell denies any chest pain, pressure, tightness, or shortness of breath. Lisa Terrell has had a nonproductive cough but no hemoptysis. Lisa Terrell denieswheezing. Lisa Terrell weight has been stable. Lisa Terrell continues to work.  ECOG/ZUBROD= 0   Past Medical History  Diagnosis Date  . Hypertension   . Mass of submandibular region 10/02/13    TWO PALPABLE LEFT MASSES NOTED  . Mass of lung PER PET 10/29/13    LEFT UPPER LOBE  . Lung nodules 11/04/13 PER PET    MEDIAL LEFT UPPER LOBE  . Varicose veins     No past surgical history on file.  Family History  Problem Relation Age of Onset  . COPD Father   . Heart disease Father   . Hyperlipidemia Father   . Hypertension Father     Social History History  Substance Use Topics  . Smoking status: Current Every Day Smoker -- 1.00 packs/day for 30 years    Types: Cigarettes  . Smokeless tobacco: Not on file  . Alcohol Use: No    Current  Outpatient Prescriptions  Medication Sig Dispense Refill  . lisinopril-hydrochlorothiazide (PRINZIDE,ZESTORETIC) 20-25 MG per tablet Take 1 tablet by mouth daily.      . Prenatal Vit-Fe Fumarate-FA (M-VIT) tablet Take 1 tablet by mouth daily.       No current facility-administered medications for this visit.    No Known Allergies  Review of Systems  Constitutional: Negative for fever, chills, activity change, appetite change and unexpected weight change.  HENT: Negative for sore throat, trouble swallowing and voice change.        Palpable knot in neck and under chin  Respiratory: Positive for cough (Nonproductive). Negative for choking, chest tightness, shortness of breath and wheezing.   Cardiovascular: Negative for chest pain and leg swelling.       Varicose veins  All other systems reviewed and are negative.    BP 140/84  Pulse 65  Resp 20  Ht 5\' 6"  (1.676 m)  Wt 172 lb (78.019 kg)  BMI 27.77 kg/m2  SpO2 98% Physical Exam  Vitals reviewed. Constitutional: Lisa Terrell is oriented to person, place, and time. Lisa Terrell appears well-developed and well-nourished. No distress.  HENT:  Head: Normocephalic and atraumatic.  Eyes: EOM are normal. Pupils are equal, round, and reactive to light.  Neck: Neck supple.  No thyromegaly present.  Cardiovascular: Normal rate, regular rhythm, normal heart sounds and intact distal pulses.  Exam reveals no gallop and no friction rub.   No murmur heard. Pulmonary/Chest: Effort normal and breath sounds normal. Lisa Terrell has no wheezes. Lisa Terrell has no rales.  Abdominal: Soft. There is no tenderness.  Musculoskeletal: Lisa Terrell exhibits no edema.  Lymphadenopathy:    Lisa Terrell has cervical adenopathy (palpable left submandibular mass and  left-sided  cervical nodes, no supraclavicular adenopathy).  Neurological: Lisa Terrell is alert and oriented to person, place, and time. No cranial nerve deficit.  No focal weakness  Skin: Skin is warm and dry.     Diagnostic Tests: CHEST 2 VIEW   COMPARISON: None.  FINDINGS:  Cardiac silhouette appears normal. 3 cm left suprahilar mass is  noted concerning for malignancy; chest CT is recommended for further  evaluation. No pneumothorax or pleural effusion is noted. Bony  thorax appears intact.  IMPRESSION:  3 cm left suprahilar mass is noted concerning for malignancy ; chest  CT scan with contrast is recommended for further evaluation.  Electronically Signed  By: Sabino Dick M.D.  On: 09/16/2013 08:19  MR NECK SOFT TISSUE ONLY WITHOUT AND WITH CONTRAST  TECHNIQUE:  Multiplanar, multisequence MR imaging was performed both before and  after administration of intravenous contrast.  CONTRAST: 66mL MULTIHANCE GADOBENATE DIMEGLUMINE 529 MG/ML IV SOLN  COMPARISON: 09/16/2013 chest x-ray.  FINDINGS:  There is a mass within the left suprahilar region highly concerning  for malignancy measuring up to 2.8 cm and incompletely assessed on  the present exam. PET CT may be considered for further delineation  (to include the head and neck region for additional evaluation of  below described findings).  Left level 1 adenopathy with lymph nodes measuring up to 1.8 x 1.8 x  1.8 cm and 1.4 x 1.2 x 1.2 cm. This raises possibility of primary  malignancy of the head and neck region. Besides the below described  left parotid mass/adenopathy, no primary neck mass is identified and  panendoscopy recommended to exclude mucosal abnormality not detected  by MR.  Anterior margin of the left parotid superficial lobe 1.5 x 1.3 x 0.9  cm lesion. Question primary of parotid mass versus intra parotid  adenopathy. ENT consultation may be considered.  Cervical spondylotic changes with spinal stenosis and cord  flattening most prominent C4-5 and less so at the C3-4 and C5-6  level.  Posterior to T1 level within the subcutaneous region there is a 1 cm  fluid appearing structure with surrounding inflammation. It is  possible this represents an inflamed  sebaceous cyst. Metastatic  deposit not excluded.  Partial opacification left mastoid air cells. No definitive mass  identified at the level of drainage of the left eustachian tube.  Visualized intracranial structures without evidence of enhancing  lesion.  Mucosal thickening maxillary sinuses.  IMPRESSION:  2.8 cm left suprahilar region highly concerning for malignancy and  incompletely assessed on the present exam. PET CT may be considered  for further delineation (to include the head and neck region for  additional evaluation of below described findings).  Left level 1 adenopathy. This raises possibility of primary  malignancy of the head and neck region. Besides the below described  left parotid mass/adenopathy, no primary neck mass is identified and  panendoscopy recommended to exclude mucosal abnormality not detected  by MR.  Prominence of the soft palate without discrete mass identified.  Anterior margin of the left parotid superficial lobe 1.5 x 1.3 x 0.9  cm lesion. Question primary of parotid mass versus intra parotid  adenopathy. ENT consultation may be considered.  Cervical spondylotic changes with spinal stenosis and cord  flattening most prominent C4-5 and less so at the C3-4 and C5-6  level.  Posterior to T1 level within the subcutaneous region there is a 1 cm  fluid appearing structure with surrounding inflammation. It is  possible this represents an inflamed sebaceous cyst. Metastatic  deposit not excluded.  Partial opacification left mastoid air cells. No definitive mass  identified at the level of drainage of the left eustachian tube.  These results were called by telephone at the time of interpretation  on 09/16/2013 at 9:33 AM to [facility], Dr. Scherry Ran nurse as  Dr. Romona Curls was in the OR, who verbally acknowledged these results.  Electronically Signed  By: Chauncey Cruel M.D.  On: 09/16/2013 09:55   NUCLEAR MEDICINE PET SKULL BASE TO THIGH  FASTING  BLOOD GLUCOSE: Value: 80 mg/dl  TECHNIQUE:  16.2 mCi F-18 FDG was injected intravenously. CT data was obtained  and used for attenuation correction and anatomic localization only.  (This was not acquired as a diagnostic CT examination.) Additional  exam technical data entered on technologist worksheet.  COMPARISON: MRI neck dated 09/16/2013  FINDINGS:  NECK  12 mm short axis left submandibular (level IB) node, max SUV 8.8  (neck PET image 63). Adjacent 17 mm short axis left level IB node  (series 2/ image 47), max SUV 13.0 (neck PET image 67).  These findings are grossly unchanged from prior MRI and remain  worrisome for nodal metastases.  CHEST  Macrolobulated 2.8 x 3.1 cm left upper lobe mass (series 2/image  70), corresponding to the prior MRI abnormality, max SUV 15.1. This  appearance is highly suspicious for primary bronchogenic neoplasm.  Two additional satellite nodules in the medial left upper lobe  measuring up to 8 mm (series 2/ images 61 and 64), max SUV 2.5.  Small mediastinal and bilateral axillary nodes measuring up to 8 mm  short axis, non FDG avid.  ABDOMEN/PELVIS  No abnormal hypermetabolic activity within the liver, pancreas,  adrenal glands, or spleen.  Atherosclerotic calcifications of the abdominal aorta and branch  vessels. Colonic diverticulosis. Left colon is mildly thick-walled  but decompressed.  8 mm short axis right inguinal node (series 2/ image 196), max SUV  6.2.  SKELETON  No focal hypermetabolic activity to suggest skeletal metastasis.  IMPRESSION:  3.1 cm left upper lobe mass, corresponding to the prior MRI  abnormality, max SUV 15.1. This appearance is highly suspicious for  primary bronchogenic neoplasm.  Two satellite nodules in the medial left upper lobe measuring up to  8 mm, max SUV 2.5, suspicious for metastases.  Two left submandibular nodes 1.7 cm short axis, max SUV 13.0. This  appearance is suspicious for nodal metastases, although  possibly  related to a nonvisualized primary head/neck cancer. ENT  consultation with visual inspection is suggested.  8 mm short axis right inguinal node, max SUV 6.2. Although the  distribution is unexpected, given the hypermetabolism, this  appearance is suspicious for nodal metastasis related to primary  lung or head/neck cancer.  Electronically Signed  By: Julian Hy M.D.  On: 11/04/2013 15:24  Impression: 63 year old smoker with a left upper lobe mass that is markedly hypermetabolic by PET CT. There are multiple other findings including satellite nodules within the left upper lobe, which would make this a T3 lesion. Lisa Terrell also has cervical and submandibular nodes as well  as a parotid mass which are hypermetabolic. Finally there is a small right inguinal node that is hypermetabolic as well.  The first question that needs to be answered is whether we have a single metastatic lung cancer, or a lung cancer and a synchronous head and neck primary. I suspected it is the latter. Then we need to try to determine the stage and prognosis of each of those lesions.   Lisa Terrell had a cervical node aspiration by Dr. Benjamine Mola today, we will await the results of that biopsy.  In the meantime I recommended to Lisa Terrell that we proceed with electromagnetic navigational bronchoscopy and endobronchial ultrasound for diagnosis and staging of the lung mass. I described the procedure to Lisa Terrell. Lisa Terrell understands it is an endoscopic procedure to be done in the operating room under general anesthesia. We will do a guided bronchoscopic sampling of the tumor in the left upper lobe and then use EBUS to sample the mediastinal and hilar lymph nodes. We discussed the risks including those related to general anesthesia. Lisa Terrell understands the risks of bleeding, pneumothorax, and false negative biopsies. Lisa Terrell accepts the risks and agrees to proceed.  Lisa Terrell will need a CT using super dimension protocol for the ENB mapping.  We will also obtain  PFTs.  Plan: ENB and EBUS on Thursday 11/19/2013

## 2013-11-19 NOTE — Preoperative (Signed)
Beta Blockers   Reason not to administer Beta Blockers:Not Applicable 

## 2013-11-19 NOTE — Progress Notes (Signed)
Lunch relief by D. Careers adviser

## 2013-11-19 NOTE — Discharge Instructions (Signed)
Do not drive or engage in heavy physical activity for 24 hours  You may cough up small amounts of blood for the next few days  My office will contact you with follow up information

## 2013-11-19 NOTE — Interval H&P Note (Signed)
History and Physical Interval Note:  11/19/2013 10:43 AM  Lisa Terrell  has presented today for surgery, with the diagnosis of LUL MASS  The various methods of treatment have been discussed with the patient and family. After consideration of risks, benefits and other options for treatment, the patient has consented to  Procedure(s): [place] (N/A) ENDOBRONCHIAL ULTRASOUND (N/A) as a surgical intervention .  The patient's history has been reviewed, patient examined, no change in status, stable for surgery.  I have reviewed the patient's chart and labs.  Questions were answered to the patient's satisfaction.     [name] C

## 2013-11-19 NOTE — Transfer of Care (Signed)
Immediate Anesthesia Transfer of Care Note  Patient: Lisa Terrell  Procedure(s) Performed: Procedure(s): VIDEO BRONCHOSCOPY WITH ENDOBRONCHIAL NAVIGATION (N/A) ENDOBRONCHIAL ULTRASOUND (N/A)  Patient Location: PACU  Anesthesia Type:General  Level of Consciousness: awake, alert  and oriented  Airway & Oxygen Therapy: Patient Spontanous Breathing and Patient connected to face mask oxygen  Post-op Assessment: Report given to PACU RN and Post -op Vital signs reviewed and stable  Post vital signs: Reviewed and stable  Complications: No apparent anesthesia complications

## 2013-11-20 NOTE — Op Note (Signed)
NAMEVERONICA, Lisa Terrell               ACCOUNT NO.:  [id number]  MEDICAL RECORD NO.:  [id number]  LOCATION:  [facility]                         FACILITY:  [facility]  PHYSICIAN:  Revonda Standard. Roxan Hockey, M.D.DATE OF BIRTH:  Jun 29, 1951  DATE OF PROCEDURE:  11/19/2013 DATE OF DISCHARGE:  11/19/2013                              OPERATIVE REPORT   PREOPERATIVE DIAGNOSIS:  Left upper lobe mass.  POSTOPERATIVE DIAGNOSIS:  Poorly differentiated carcinoma.  PROCEDURE:  Bronchoscopy, endobronchial ultrasound with mediastinal node aspirations, and electromagnetic navigational bronchoscopy with brushings and biopsies.  SURGEON:  Modesto Charon, M.D.  ANESTHESIA:  General.  FINDINGS:  Quick prep of aspirations of mediastinal nodes revealed no evidence of malignancy.  Brushings of left upper lobe mass revealed a poorly differentiated malignancy, unable to be further characterized.  CLINICAL NOTE:  Lisa Terrell is a 63 year old woman with a history of tobacco abuse who presented with a "knot in her neck." An MRI of the neck showed multiple enlarged lymph nodes as well a 2.8 cm left suprahilar lung mass.  PET scan showed this lesion to be hypermetabolic. She has had an aspiration of a neck lymph node, the results of that are still pending.  She was advised to undergo bronchoscopy and endobronchial ultrasound for diagnosis and staging of her lung mass and to determine whether this was the primary site or whether she might have 2 separate primaries. The indications risks, benefits, and alternatives were discussed in detail with the patient.  She understood and accepted the risks and agreed to proceed.  Lisa Terrell was brought the operating room on November 19, 2013.  There, she had induction of general anesthesia.  Flexible fiberoptic bronchoscopy was carried out via the endotracheal tube. It revealed normal endobronchial anatomy and no endobronchial lesions were seen to the level of subsegmental bronchi.   The bronchoscope was removed.  The endobronchial ultrasound probe was then placed and systematic inspection of the mediastinal lymph nodes was carried out.  The level 4 L nodes could be seen.  Duplex was used to confirm the location of the node in relation to the blood vessels.  The node then was sampled. The aspirating needle was passed into the node using ultrasound visualization. With each aspiration, 10 passes were made with a needle while visualizing with ultrasound to ensure that the needle remained within the node. Two separate aspirations were performed with the 4 L node. Next a level 7 node was identified and 2 aspirations were performed from it as well.  The level 10 L node was visualized.  This was very difficult to visualize. An aspiration was performed.  The node was in very close proximity to the surrounding vessels and it was relatively small.  It was not felt safe to do a second aspiration as the same plane could not be obtained again.  Finally, a 4R node was identified and aspirated as well.  These were sent for quick prep.  The bronchoscope then was reinserted. The locatable guide for the navigational bronchoscopy was placed through the bronchoscope. Mapping of the bronchial tree was performed.  There was a good correlation with the virtual bronchoscopy.  The scope then was navigated to the left upper  lobe bronchus and the locatable guide with a 180 degree sheath then was advanced to within 1.5 cm of the tumor.  The locatable guide was removed and the sheath was left in place. Sampling was performed.  All sampling was visualized with fluoroscopy. First, brushings were obtained.  These were sent for quick prep. While awaiting those results, multiple biopsies were taken. Intermittently, the locatable guide was replaced to ensure that the sheath remained in the proper location.  The sheath was slightly repositioned but still was within close proximity of the mass and  additional biopsies and brushings were obtained.  The quick prep returned poorly differentiated carcinoma. The pathologist was unable to determine on quick prep whether this was primary lung cancer versus metastatic cancer.  Final inspection was made, there was no bleeding.  The scope was withdrawn.  The patient was extubated in the operating room and taken to the postanesthetic care unit in good condition.     Revonda Standard Roxan Hockey, M.D.     SCH/MEDQ  D:  11/19/2013  T:  11/20/2013  Job:  [id number]

## 2013-11-23 ENCOUNTER — Encounter (HOSPITAL_COMMUNITY): Payer: Self-pay | Admitting: Thoracic Surgery (Cardiothoracic Vascular Surgery)

## 2013-11-26 ENCOUNTER — Ambulatory Visit (INDEPENDENT_AMBULATORY_CARE_PROVIDER_SITE_OTHER): Payer: 59 | Admitting: Otolaryngology

## 2013-11-26 DIAGNOSIS — R599 Enlarged lymph nodes, unspecified: Secondary | ICD-10-CM

## 2013-11-26 DIAGNOSIS — D37039 Neoplasm of uncertain behavior of the major salivary glands, unspecified: Secondary | ICD-10-CM

## 2013-11-27 ENCOUNTER — Encounter (HOSPITAL_BASED_OUTPATIENT_CLINIC_OR_DEPARTMENT_OTHER): Payer: Self-pay | Admitting: *Deleted

## 2013-11-27 NOTE — Progress Notes (Signed)
Had a bronchoscopy 11/18/13 for bx Had labs and ekg

## 2013-12-01 ENCOUNTER — Encounter (HOSPITAL_BASED_OUTPATIENT_CLINIC_OR_DEPARTMENT_OTHER)
Admission: RE | Disposition: A | Discharge status: Home / Self Care | Payer: Self-pay | Source: Ambulatory Visit | Attending: Otolaryngology

## 2013-12-01 ENCOUNTER — Ambulatory Visit (HOSPITAL_BASED_OUTPATIENT_CLINIC_OR_DEPARTMENT_OTHER)
Admission: RE | Admit: 2013-12-01 | Discharge: 2013-12-01 | Disposition: A | Discharge status: Home / Self Care | Payer: 59 | Source: Ambulatory Visit | Attending: Otolaryngology | Admitting: Otolaryngology

## 2013-12-01 ENCOUNTER — Encounter: Payer: Self-pay | Admitting: Thoracic Surgery (Cardiothoracic Vascular Surgery)

## 2013-12-01 ENCOUNTER — Encounter (HOSPITAL_BASED_OUTPATIENT_CLINIC_OR_DEPARTMENT_OTHER): Payer: Self-pay | Admitting: *Deleted

## 2013-12-01 ENCOUNTER — Ambulatory Visit (INDEPENDENT_AMBULATORY_CARE_PROVIDER_SITE_OTHER): Payer: 59 | Admitting: Thoracic Surgery (Cardiothoracic Vascular Surgery)

## 2013-12-01 ENCOUNTER — Ambulatory Visit (HOSPITAL_BASED_OUTPATIENT_CLINIC_OR_DEPARTMENT_OTHER): Payer: 59 | Admitting: Anesthesiology

## 2013-12-01 ENCOUNTER — Encounter (HOSPITAL_BASED_OUTPATIENT_CLINIC_OR_DEPARTMENT_OTHER): Payer: 59 | Admitting: Anesthesiology

## 2013-12-01 VITALS — BP 154/77 | HR 72 | Resp 16 | Ht 66.0 in | Wt 172.0 lb

## 2013-12-01 DIAGNOSIS — C3490 Malignant neoplasm of unspecified part of unspecified bronchus or lung: Secondary | ICD-10-CM | POA: Insufficient documentation

## 2013-12-01 DIAGNOSIS — R221 Localized swelling, mass and lump, neck: Principal | ICD-10-CM

## 2013-12-01 DIAGNOSIS — I10 Essential (primary) hypertension: Secondary | ICD-10-CM | POA: Insufficient documentation

## 2013-12-01 DIAGNOSIS — R220 Localized swelling, mass and lump, head: Secondary | ICD-10-CM

## 2013-12-01 DIAGNOSIS — F17200 Nicotine dependence, unspecified, uncomplicated: Secondary | ICD-10-CM | POA: Insufficient documentation

## 2013-12-01 DIAGNOSIS — C3410 Malignant neoplasm of upper lobe, unspecified bronchus or lung: Secondary | ICD-10-CM

## 2013-12-01 HISTORY — PX: MASS EXCISION: SHX2000

## 2013-12-01 LAB — POCT HEMOGLOBIN-HEMACUE: HEMOGLOBIN: 13.4 g/dL (ref 12.0–15.0)

## 2013-12-01 SURGERY — EXCISION MASS
Anesthesia: General

## 2013-12-01 MED ORDER — OXYCODONE HCL 5 MG PO TABS: 5.0000 mg | ORAL_TABLET | Freq: Once | ORAL | Status: DC | PRN

## 2013-12-01 MED ORDER — PROMETHAZINE HCL 25 MG/ML IJ SOLN: 6.2500 mg | INTRAMUSCULAR | Status: DC | PRN

## 2013-12-01 MED ORDER — PROPOFOL 10 MG/ML IV BOLUS
INTRAVENOUS | Status: DC | PRN
  Administered 2013-12-01: 150 mg via INTRAVENOUS
  Administered 2013-12-01: 50 mg via INTRAVENOUS

## 2013-12-01 MED ORDER — LIDOCAINE-EPINEPHRINE 1 %-1:100000 IJ SOLN
INTRAMUSCULAR | Status: AC
  Filled 2013-12-01: qty 1

## 2013-12-01 MED ORDER — SUCCINYLCHOLINE CHLORIDE 20 MG/ML IJ SOLN
INTRAMUSCULAR | Status: DC | PRN
  Administered 2013-12-01: 100 mg via INTRAVENOUS

## 2013-12-01 MED ORDER — DEXAMETHASONE SODIUM PHOSPHATE 4 MG/ML IJ SOLN
INTRAMUSCULAR | Status: DC | PRN
  Administered 2013-12-01: 10 mg via INTRAVENOUS

## 2013-12-01 MED ORDER — FENTANYL CITRATE 0.05 MG/ML IJ SOLN
INTRAMUSCULAR | Status: DC | PRN
  Administered 2013-12-01: 50 ug via INTRAVENOUS
  Administered 2013-12-01: 100 ug via INTRAVENOUS

## 2013-12-01 MED ORDER — EPHEDRINE SULFATE 50 MG/ML IJ SOLN
INTRAMUSCULAR | Status: DC | PRN
  Administered 2013-12-01: 10 mg via INTRAVENOUS

## 2013-12-01 MED ORDER — OXYCODONE HCL 5 MG/5ML PO SOLN: 5.0000 mg | Freq: Once | ORAL | Status: DC | PRN

## 2013-12-01 MED ORDER — HYDROCODONE-ACETAMINOPHEN 7.5-325 MG PO TABS: 1.0000 | ORAL_TABLET | Freq: Four times a day (QID) | ORAL | Status: DC | PRN

## 2013-12-01 MED ORDER — ONDANSETRON HCL 4 MG/2ML IJ SOLN
INTRAMUSCULAR | Status: DC | PRN
  Administered 2013-12-01: 4 mg via INTRAVENOUS

## 2013-12-01 MED ORDER — CEFAZOLIN SODIUM-DEXTROSE 2-3 GM-% IV SOLR
INTRAVENOUS | Status: DC | PRN
  Administered 2013-12-01: 2 g via INTRAVENOUS

## 2013-12-01 MED ORDER — MIDAZOLAM HCL 2 MG/2ML IJ SOLN
INTRAMUSCULAR | Status: AC
  Filled 2013-12-01: qty 2

## 2013-12-01 MED ORDER — FENTANYL CITRATE 0.05 MG/ML IJ SOLN: 50.0000 ug | INTRAMUSCULAR | Status: DC | PRN

## 2013-12-01 MED ORDER — HYDROMORPHONE HCL PF 1 MG/ML IJ SOLN: 0.2500 mg | INTRAMUSCULAR | Status: DC | PRN

## 2013-12-01 MED ORDER — LIDOCAINE-EPINEPHRINE 1 %-1:100000 IJ SOLN
INTRAMUSCULAR | Status: DC | PRN
  Administered 2013-12-01: 2 mL

## 2013-12-01 MED ORDER — MIDAZOLAM HCL 5 MG/5ML IJ SOLN
INTRAMUSCULAR | Status: DC | PRN
  Administered 2013-12-01: 2 mg via INTRAVENOUS

## 2013-12-01 MED ORDER — AMOXICILLIN 875 MG PO TABS: 875.0000 mg | ORAL_TABLET | Freq: Two times a day (BID) | ORAL | Status: AC

## 2013-12-01 MED ORDER — PROPOFOL 10 MG/ML IV EMUL
INTRAVENOUS | Status: AC
  Filled 2013-12-01: qty 50

## 2013-12-01 MED ORDER — FENTANYL CITRATE 0.05 MG/ML IJ SOLN
INTRAMUSCULAR | Status: AC
  Filled 2013-12-01: qty 4

## 2013-12-01 MED ORDER — LACTATED RINGERS IV SOLN
INTRAVENOUS | Status: DC
  Administered 2013-12-01 (×2): via INTRAVENOUS

## 2013-12-01 MED ORDER — CEFAZOLIN SODIUM-DEXTROSE 2-3 GM-% IV SOLR
INTRAVENOUS | Status: AC
  Filled 2013-12-01: qty 50

## 2013-12-01 MED ORDER — MIDAZOLAM HCL 2 MG/2ML IJ SOLN: 1.0000 mg | INTRAMUSCULAR | Status: DC | PRN

## 2013-12-01 SURGICAL SUPPLY — 55 items
ATTRACTOMAT 16X20 MAGNETIC DRP (DRAPES) IMPLANT
BLADE SURG 15 STRL LF DISP TIS (BLADE) ×1 IMPLANT
BLADE SURG 15 STRL SS (BLADE) ×2
CANISTER SUCT 1200ML W/VALVE (MISCELLANEOUS) ×3 IMPLANT
CORDS BIPOLAR (ELECTRODE) ×6 IMPLANT
COVER MAYO STAND STRL (DRAPES) ×3 IMPLANT
COVER TABLE BACK 60X90 (DRAPES) ×3 IMPLANT
DECANTER SPIKE VIAL GLASS SM (MISCELLANEOUS) IMPLANT
DERMABOND ADVANCED (GAUZE/BANDAGES/DRESSINGS) ×2
DERMABOND ADVANCED .7 DNX12 (GAUZE/BANDAGES/DRESSINGS) ×1 IMPLANT
DRAIN JACKSON RD 7FR 3/32 (WOUND CARE) IMPLANT
DRAIN PENROSE 1/4X12 LTX STRL (WOUND CARE) IMPLANT
DRAIN TLS ROUND 10FR (DRAIN) IMPLANT
DRAPE U-SHAPE 76X120 STRL (DRAPES) ×3 IMPLANT
ELECT COATED BLADE 2.86 ST (ELECTRODE) ×3 IMPLANT
ELECT NEEDLE BLADE 2-5/6 (NEEDLE) IMPLANT
ELECT PAIRED SUBDERMAL (MISCELLANEOUS)
ELECT REM PT RETURN 9FT ADLT (ELECTROSURGICAL) ×3
ELECTRODE PAIRED SUBDERMAL (MISCELLANEOUS) IMPLANT
ELECTRODE REM PT RTRN 9FT ADLT (ELECTROSURGICAL) ×1 IMPLANT
EVACUATOR SILICONE 100CC (DRAIN) IMPLANT
GAUZE SPONGE 4X4 16PLY XRAY LF (GAUZE/BANDAGES/DRESSINGS) IMPLANT
GLOVE BIO SURGEON STRL SZ7.5 (GLOVE) ×3 IMPLANT
GLOVE SURG SS PI 7.0 STRL IVOR (GLOVE) ×3 IMPLANT
GOWN STRL REUS W/ TWL LRG LVL3 (GOWN DISPOSABLE) ×2 IMPLANT
GOWN STRL REUS W/TWL LRG LVL3 (GOWN DISPOSABLE) ×4
LOCATOR NERVE 3 VOLT (DISPOSABLE) IMPLANT
NEEDLE HYPO 25X1 1.5 SAFETY (NEEDLE) ×3 IMPLANT
NS IRRIG 1000ML POUR BTL (IV SOLUTION) ×3 IMPLANT
PACK BASIN DAY SURGERY FS (CUSTOM PROCEDURE TRAY) ×3 IMPLANT
PENCIL BUTTON HOLSTER BLD 10FT (ELECTRODE) ×3 IMPLANT
PIN SAFETY STERILE (MISCELLANEOUS) IMPLANT
PROBE NERVBE PRASS .33 (MISCELLANEOUS) IMPLANT
SLEEVE SCD COMPRESS KNEE MED (MISCELLANEOUS) ×3 IMPLANT
SPONGE GAUZE 2X2 8PLY STER LF (GAUZE/BANDAGES/DRESSINGS)
SPONGE GAUZE 2X2 8PLY STRL LF (GAUZE/BANDAGES/DRESSINGS) IMPLANT
SPONGE GAUZE 4X4 12PLY STER LF (GAUZE/BANDAGES/DRESSINGS) IMPLANT
SUCTION FRAZIER TIP 10 FR DISP (SUCTIONS) IMPLANT
SUT CHROMIC 4 0 P 3 18 (SUTURE) IMPLANT
SUT ETHILON 3 0 PS 1 (SUTURE) IMPLANT
SUT ETHILON 5 0 P 3 18 (SUTURE)
SUT NYLON ETHILON 5-0 P-3 1X18 (SUTURE) IMPLANT
SUT PROLENE 3 0 PS 2 (SUTURE) ×3 IMPLANT
SUT PROLENE 4 0 P 3 18 (SUTURE) IMPLANT
SUT SILK 3 0 TIES 17X18 (SUTURE)
SUT SILK 3-0 18XBRD TIE BLK (SUTURE) IMPLANT
SUT SILK 4 0 TIES 17X18 (SUTURE) IMPLANT
SUT VIC AB 3-0 FS2 27 (SUTURE) IMPLANT
SUT VIC AB 4-0 P-3 18XBRD (SUTURE) ×1 IMPLANT
SUT VIC AB 4-0 P3 18 (SUTURE) ×2
SYR BULB 3OZ (MISCELLANEOUS) ×3 IMPLANT
TOWEL OR 17X24 6PK STRL BLUE (TOWEL DISPOSABLE) ×3 IMPLANT
TUBE CONNECTING 20'X1/4 (TUBING) ×1
TUBE CONNECTING 20X1/4 (TUBING) ×2 IMPLANT
YANKAUER SUCT BULB TIP NO VENT (SUCTIONS) IMPLANT

## 2013-12-01 NOTE — Progress Notes (Signed)
Patient ID: Lisa Terrell, female   DOB: 04-Oct-1951, 63 y.o.   MRN: [id number]   Mrs. Digilio returns today for a visit was scheduled after her bronchoscopy.  Bronchoscopy showed non-small cell carcinoma.  Earlier today she had excision of a cervical lymph node by Dr. Benjamine Mola. We do not have the results of that biopsy yet.  She is to see Dr. Benjamine Mola back in about 10 days to discuss the results.  I am going to schedule her to come back and see me in 2 weeks. If we were able to get the results sooner we may be able to call her and get that appointment moved up

## 2013-12-01 NOTE — Anesthesia Procedure Notes (Signed)
Procedure Name: Intubation Date/Time: 12/01/2013 10:40 AM Performed by: Maryella Shivers Pre-anesthesia Checklist: Patient identified, Emergency Drugs available, Suction available and Patient being monitored Patient Re-evaluated:Patient Re-evaluated prior to inductionOxygen Delivery Method: Circle System Utilized Preoxygenation: Pre-oxygenation with 100% oxygen Intubation Type: IV induction Ventilation: Mask ventilation without difficulty Laryngoscope Size: Mac and 3 Grade View: Grade I Tube type: Oral Tube size: 7.0 mm Number of attempts: 1 Airway Equipment and Method: stylet and oral airway Placement Confirmation: ETT inserted through vocal cords under direct vision,  positive ETCO2 and breath sounds checked- equal and bilateral Secured at: 22 cm Tube secured with: Tape Dental Injury: Teeth and Oropharynx as per pre-operative assessment

## 2013-12-01 NOTE — Anesthesia Preprocedure Evaluation (Signed)
Anesthesia Evaluation  Patient identified by MRN, date of birth, ID band Patient awake    Reviewed: Allergy & Precautions, H&P , NPO status , Patient's Chart, lab work & pertinent test results  History of Anesthesia Complications Negative for: history of anesthetic complications  Airway Mallampati: I TM Distance: >3 FB Neck ROM: Full    Dental  (+) Dental Advisory Given and Teeth Intact   Pulmonary Current Smoker, former smoker,  Submandibular lymph node enlargement, lung nodule breath sounds clear to auscultation        Cardiovascular hypertension, Pt. on medications Rhythm:Regular Rate:Normal     Neuro/Psych negative neurological ROS     GI/Hepatic negative GI ROS, Neg liver ROS,   Endo/Other  negative endocrine ROS  Renal/GU      Musculoskeletal   Abdominal   Peds  Hematology negative hematology ROS (+)   Anesthesia Other Findings   Reproductive/Obstetrics                           Anesthesia Physical Anesthesia Plan  ASA: II  Anesthesia Plan: General   Post-op Pain Management:    Induction: Intravenous  Airway Management Planned: Oral ETT  Additional Equipment:   Intra-op Plan:   Post-operative Plan: Extubation in OR  Informed Consent:   Dental advisory given  Plan Discussed with: CRNA and Surgeon  Anesthesia Plan Comments:         Anesthesia Quick Evaluation

## 2013-12-01 NOTE — Anesthesia Postprocedure Evaluation (Signed)
  Anesthesia Post-op Note  Patient: Lisa Terrell  Procedure(s) Performed: Procedure(s): EXCISION NECK MASS--SUBMANDIBULAR (N/A)  Patient Location: PACU  Anesthesia Type:General  Level of Consciousness: awake and alert   Airway and Oxygen Therapy: Patient Spontanous Breathing  Post-op Pain: none  Post-op Assessment: Post-op Vital signs reviewed, Patient's Cardiovascular Status Stable and Respiratory Function Stable  Post-op Vital Signs: Reviewed  Filed Vitals:   12/01/13 1215  BP: 124/67  Pulse: 65  Temp:   Resp: 19    Complications: No apparent anesthesia complications

## 2013-12-01 NOTE — H&P (Signed)
  H&P Update  Pt's original H&P dated 11/26/13 reviewed and placed in chart (to be scanned).  I personally examined the patient today.  No change in health. Proceed with neck mass excision.

## 2013-12-01 NOTE — Brief Op Note (Signed)
12/01/2013  11:22 AM  PATIENT:  Lisa Terrell  63 y.o. female  PRE-OPERATIVE DIAGNOSIS:  neck mass  POST-OPERATIVE DIAGNOSIS:  neck mass--submandibular  PROCEDURE:  Procedure(s): EXCISION NECK MASS--SUBMANDIBULAR (N/A)  SURGEON:  Surgeon(s) and Role:    * Ascencion Dike, MD - Primary  PHYSICIAN ASSISTANT:   ASSISTANTS: none   ANESTHESIA:   general  EBL:  Total I/O In: 1400 [I.V.:1400] Out: -   BLOOD ADMINISTERED:none  DRAINS: none   LOCAL MEDICATIONS USED:  LIDOCAINE   SPECIMEN:  Source of Specimen:  neck mass  DISPOSITION OF SPECIMEN:  PATHOLOGY  COUNTS:  YES  TOURNIQUET:  * No tourniquets in log *  DICTATION: .Other Dictation: Dictation Number [id number]  PLAN OF CARE: Discharge to home after PACU  PATIENT DISPOSITION:  PACU - hemodynamically stable.   Delay start of Pharmacological VTE agent (>24hrs) due to surgical blood loss or risk of bleeding: not applicable

## 2013-12-01 NOTE — Transfer of Care (Signed)
Immediate Anesthesia Transfer of Care Note  Patient: Lisa Terrell  Procedure(s) Performed: Procedure(s): EXCISION NECK MASS--SUBMANDIBULAR (N/A)  Patient Location: PACU  Anesthesia Type:General  Level of Consciousness: sedated  Airway & Oxygen Therapy: Patient Spontanous Breathing and Patient connected to face mask oxygen  Post-op Assessment: Report given to PACU RN and Post -op Vital signs reviewed and stable  Post vital signs: Reviewed and stable  Complications: No apparent anesthesia complications

## 2013-12-01 NOTE — Discharge Instructions (Addendum)
Biopsy Care After Refer to this sheet in the next few weeks. These instructions provide you with information on caring for yourself after your procedure. Your caregiver may also give you more specific instructions. Your treatment has been planned according to current medical practices, but problems sometimes occur. Call your caregiver if you have any problems or questions after your procedure. If you had a fine needle biopsy, you may have soreness at the biopsy site for 1 to 2 days. If you had an open biopsy, you may have soreness at the biopsy site for 3 to 4 days. HOME CARE INSTRUCTIONS   You may resume normal diet and activities as directed.  Change bandages (dressings) as directed. If your wound was closed with a skin glue (adhesive), it will wear off and begin to peel in 7 days.  Only take over-the-counter or prescription medicines for pain, discomfort, or fever as directed by your caregiver.  Ask your caregiver when you can bathe and get your wound wet. SEEK IMMEDIATE MEDICAL CARE IF:   You have increased bleeding (more than a small spot) from the biopsy site.  You notice redness, swelling, or increasing pain at the biopsy site.  You have pus coming from the biopsy site.  You have a fever.  You notice a bad smell coming from the biopsy site or dressing.  You have a rash, have difficulty breathing, or have any allergic problems. MAKE SURE YOU:   Understand these instructions.  Will watch your condition.  Will get help right away if you are not doing well or get worse. Document Released: 05/04/2005 Document Revised: 01/07/2012 Document Reviewed: 04/12/2011 [facility] Patient Information 2014 [place].    Post Anesthesia Home Care Instructions  Activity: Get plenty of rest for the remainder of the day. A responsible adult should stay with you for 24 hours following the procedure.  For the next 24 hours, DO NOT: -Drive a car -Paediatric nurse -Drink alcoholic  beverages -Take any medication unless instructed by your physician -Make any legal decisions or sign important papers.  Meals: Start with liquid foods such as gelatin or soup. Progress to regular foods as tolerated. Avoid greasy, spicy, heavy foods. If nausea and/or vomiting occur, drink only clear liquids until the nausea and/or vomiting subsides. Call your physician if vomiting continues.  Special Instructions/Symptoms: Your throat may feel dry or sore from the anesthesia or the breathing tube placed in your throat during surgery. If this causes discomfort, gargle with warm salt water. The discomfort should disappear within 24 hours.   Call your surgeon if you experience:   1.  Fever over 101.0. 2.  Inability to urinate. 3.  Nausea and/or vomiting. 4.  Extreme swelling or bruising at the surgical site. 5.  Continued bleeding from the incision. 6.  Increased pain, redness or drainage from the incision. 7.  Problems related to your pain medication.

## 2013-12-02 NOTE — Op Note (Signed)
NAMECHRISS, Lisa Terrell               ACCOUNT NO.:  [id number]  MEDICAL RECORD NO.:  [id number]  LOCATION:                                 FACILITY:  PHYSICIAN:  Leta Baptist, MD            DATE OF BIRTH:  05/12/1951  DATE OF PROCEDURE:  12/01/2013 DATE OF DISCHARGE:                              OPERATIVE REPORT   SURGEON:  Leta Baptist, MD  PREOPERATIVE DIAGNOSIS:  Submandibular mass.  POSTOPERATIVE DIAGNOSIS:  Submandibular mass.  PROCEDURE PERFORMED:  Excision of submandibular mass.  ANESTHESIA:  General endotracheal tube anesthesia.  COMPLICATIONS:  None.  ESTIMATED BLOOD LOSS:  Minimal.  INDICATION FOR PROCEDURE:  The patient is a 63 year old female with a history of enlarging submandibular mass.  She previously underwent fine- needle aspiration biopsy of the mass.  It was consistent with lymphoid tissue.  However, the definitive diagnosis could not be made.  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  Based on the above findings, the decision was made for the patient to undergo excisional biopsy of the submandibular mass.  The risks, benefits, alternatives, and details of the procedure were discussed with the patient.  Questions were invited and answered.  Informed consent was obtained.  DESCRIPTION:  The patient was taken to the operating room and placed supine on the operating table.  General endotracheal tube anesthesia was administered by the anesthesiologist.  The patient was positioned and prepped and draped in the standard fashion for left neck mass excision. A 2 cm left submandibular mass was noted.  The mass was firm, but mobile.  A 1% lidocaine where 1:100,000 epinephrine was injected over the submandibular mass.  An incision was made over the mass, and subplatysmal flaps were elevated superiorly and inferiorly.  The entire mass was then carefully dissected free from the surrounding soft tissue. The  mass was sent as a fresh specimen to the Pathology Department.  The surgical site was copiously irrigated.  The incision was closed in layers with 4-0 Vicryl and Dermabond.  The care of the patient was turned over to the anesthesiologist.  The patient was awakened from anesthesia without difficulty.  She was transferred to the recovery room in good condition.  OPERATIVE FINDINGS:  A 2-cm left submandibular mass was noted.  SPECIMEN:  Left submandibular mass.  FOLLOWUP CARE:  The patient will be discharged home once she is awake and alert.  She will follow up in my office in 1 week.  She will be placed on amoxicillin 875 mg p.o. b.i.d. for 5 days and Vicodin p.r.n. pain.     Leta Baptist, MD     ST/MEDQ  D:  12/01/2013  T:  12/02/2013  Job:  [id number]

## 2013-12-03 ENCOUNTER — Encounter (HOSPITAL_BASED_OUTPATIENT_CLINIC_OR_DEPARTMENT_OTHER): Payer: Self-pay | Admitting: Otolaryngology

## 2013-12-10 ENCOUNTER — Ambulatory Visit (INDEPENDENT_AMBULATORY_CARE_PROVIDER_SITE_OTHER): Payer: 59 | Admitting: Otolaryngology

## 2013-12-15 ENCOUNTER — Ambulatory Visit: Payer: 59 | Admitting: Thoracic Surgery (Cardiothoracic Vascular Surgery)

## 2013-12-29 ENCOUNTER — Ambulatory Visit (INDEPENDENT_AMBULATORY_CARE_PROVIDER_SITE_OTHER): Payer: 59 | Admitting: Thoracic Surgery (Cardiothoracic Vascular Surgery)

## 2013-12-29 ENCOUNTER — Other Ambulatory Visit: Payer: Self-pay | Admitting: *Deleted

## 2013-12-29 ENCOUNTER — Encounter: Payer: Self-pay | Admitting: Thoracic Surgery (Cardiothoracic Vascular Surgery)

## 2013-12-29 VITALS — BP 161/88 | HR 60 | Resp 20 | Ht 66.0 in | Wt 172.0 lb

## 2013-12-29 DIAGNOSIS — C3490 Malignant neoplasm of unspecified part of unspecified bronchus or lung: Secondary | ICD-10-CM

## 2013-12-29 DIAGNOSIS — C3492 Malignant neoplasm of unspecified part of left bronchus or lung: Secondary | ICD-10-CM

## 2013-12-29 NOTE — Progress Notes (Signed)
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  Lisa Terrell is a 63 year old smoker (1 pack a day for 30 years) who about 6 months ago noticed a "knot" in her neck. She wasn't having any pain, fever, chills, difficulty swallowing or shortness of breath. She didn't pay much attention to it for a while, however finally she became concerned and went to Dr. Legrand Rams. She eventually was referred to Dr. Romona Curls and then finally to Dr. Benjamine Mola. An MRI was done of the neck which showed a 2.8 cm left suprahilar lung mass with multiple level I neck nodes measuring 1.8 and 1.4 cm. There was a 1.5 cm left parotid mass. She was also noted to have a 2.8 cm suprahilar lung mass.   A PET CT was done which showed these lesions to be hypermetabolic.   I did bronchoscopy and biopsy of the left upper lobe mass on 11/20/2013 which was positive for squamous cell carcinoma.  She had an aspiration of a neck lymph node by Dr. Benjamine Mola which was unremarkable. She then had the nodes resected on 12/01/2013. Pathology showed lymphoid hyperplasia.  She missed her appointment last week due to the weather.  She continues to smoke a pack a day. She denies any chest pain, pressure, tightness, or shortness of breath. She has had a nonproductive cough but no hemoptysis. She denieswheezing. Her weight has been stable. She continues to work.  ECOG/ZUBROD= 0   Past Medical History  Diagnosis Date  . Hypertension   . Mass of submandibular region 10/02/13    TWO PALPABLE LEFT MASSES NOTED  . Mass of lung PER PET 10/29/13    LEFT UPPER LOBE  . Lung nodules 11/04/13 PER PET    MEDIAL LEFT UPPER LOBE  . Varicose veins    Past Surgical History  Procedure Laterality Date  . Video bronchoscopy with endobronchial navigation N/A 11/19/2013    Procedure: VIDEO BRONCHOSCOPY WITH ENDOBRONCHIAL NAVIGATION;  Surgeon: Melrose Nakayama, MD;  Location: [facility];  Service: Thoracic;  Laterality:  N/A;  . Endobronchial ultrasound N/A 11/19/2013    Procedure: ENDOBRONCHIAL ULTRASOUND;  Surgeon: Melrose Nakayama, MD;  Location: [facility];  Service: Thoracic;  Laterality: N/A;  . Hammer toe surgery  2004    right  . Tubal ligation    . Mass excision N/A 12/01/2013    Procedure: EXCISION NECK MASS--SUBMANDIBULAR;  Surgeon: Ascencion Dike, MD;  Location: [facility];  Service: ENT;  Laterality: N/A;    Current Outpatient Prescriptions  Medication Sig Dispense Refill  . ALOE VERA PO Take 1 capsule by mouth daily.      Marland Kitchen HYDROcodone-acetaminophen (NORCO) 7.5-325 MG per tablet Take 1 tablet by mouth every 6 (six) hours as needed for moderate pain.  15 tablet  0  . lisinopril-hydrochlorothiazide (PRINZIDE,ZESTORETIC) 20-25 MG per tablet Take 1 tablet by mouth daily.       No current facility-administered medications for this visit.    Physical Exam BP 161/88  Pulse 60  Resp 20  Ht 5\' 6"  (1.676 m)  Wt 172 lb (78.019 kg)  BMI 27.77 kg/m2  SpO2 98% Well-developed and well-nourished 63 year old woman in no acute distress Neurologic alert and oriented x3 with no focal deficits Neck surgical scar healing well, no palpable adenopathy Cardiac regular rate and rhythm normal S1-S2, no rubs murmurs or gallops Lungs clear with equal breath sounds bilaterally Abdomen soft nontender Extremities without clubbing cyanosis or  edema   Diagnostic Tests: PATH FINAL DIAGNOSIS Diagnosis Lung, biopsy, Left upper lobe - POSITIVE FOR SQUAMOUS CELL CARCINOMA.  CT CHEST WITHOUT CONTRAST  TECHNIQUE:  Multidetector CT imaging of the chest was performed using thin slice  collimation for electromagnetic bronchoscopy planning purposes,  without intravenous contrast.  COMPARISON: PET-CT 11/04/2013.  FINDINGS:  The macro lobulated left suprahilar mass has slightly enlarged,  measuring 3.3 x 3.0 cm on image 15 (previously 3.1 x 2.8 cm). There  is narrowing of the left upper lobe bronchus. The  two adjacent left  upper lobe satellite nodules are stable, measuring 6 mm on image 8  and 7 mm on image 10. No other pulmonary nodules are seen.  There are no pathologically enlarged mediastinal or hilar lymph  nodes. The enlarged left submandibular node is partially imaged,  measuring 1.6 cm on image 2. There is no pleural or pericardial  effusion. A small hiatal hernia is noted.  Visualized upper abdomen appears unremarkable. There are no  worrisome findings.  IMPRESSION:  1. Slight interval enlargement of dominant hypermetabolic left upper  lobe mass, now measuring up to 3.3 cm in diameter.  2. The adjacent left upper lobe satellite nodules and enlarged  submandibular lymph nodes are grossly stable.  Electronically Signed  By: Camie Patience M.D.  On: 11/17/2013 13:55  NUCLEAR MEDICINE PET SKULL BASE TO THIGH  FASTING BLOOD GLUCOSE: Value: 80 mg/dl  TECHNIQUE:  16.2 mCi F-18 FDG was injected intravenously. CT data was obtained  and used for attenuation correction and anatomic localization only.  (This was not acquired as a diagnostic CT examination.) Additional  exam technical data entered on technologist worksheet.  COMPARISON: MRI neck dated 09/16/2013  FINDINGS:  NECK  12 mm short axis left submandibular (level IB) node, max SUV 8.8  (neck PET image 63). Adjacent 17 mm short axis left level IB node  (series 2/ image 47), max SUV 13.0 (neck PET image 67).  These findings are grossly unchanged from prior MRI and remain  worrisome for nodal metastases.  CHEST  Macrolobulated 2.8 x 3.1 cm left upper lobe mass (series 2/image  70), corresponding to the prior MRI abnormality, max SUV 15.1. This  appearance is highly suspicious for primary bronchogenic neoplasm.  Two additional satellite nodules in the medial left upper lobe  measuring up to 8 mm (series 2/ images 61 and 64), max SUV 2.5.  Small mediastinal and bilateral axillary nodes measuring up to 8 mm  short axis, non FDG  avid.  ABDOMEN/PELVIS  No abnormal hypermetabolic activity within the liver, pancreas,  adrenal glands, or spleen.  Atherosclerotic calcifications of the abdominal aorta and branch  vessels. Colonic diverticulosis. Left colon is mildly thick-walled  but decompressed.  8 mm short axis right inguinal node (series 2/ image 196), max SUV  6.2.  SKELETON  No focal hypermetabolic activity to suggest skeletal metastasis.  IMPRESSION:  3.1 cm left upper lobe mass, corresponding to the prior MRI  abnormality, max SUV 15.1. This appearance is highly suspicious for  primary bronchogenic neoplasm.  Two satellite nodules in the medial left upper lobe measuring up to  8 mm, max SUV 2.5, suspicious for metastases.  Two left submandibular nodes 1.7 cm short axis, max SUV 13.0. This  appearance is suspicious for nodal metastases, although possibly  related to a nonvisualized primary head/neck cancer. ENT  consultation with visual inspection is suggested.  8 mm short axis right inguinal node, max SUV 6.2. Although the  distribution is unexpected,  given the hypermetabolism, this  appearance is suspicious for nodal metastasis related to primary  lung or head/neck cancer.  Electronically Signed  By: Julian Hy M.D.  On: 11/04/2013 15:24  Pulmonary function testing FVC 2.5 one (92%) FEV1 1.96 (92%) FEV1 post bronchodilator 2.13 (100%) DLCO 81%  Impression: Lisa Terrell is a 63 year old woman with a history of tobacco abuse who has a squamous cell carcinoma of the left upper lobe. This is a 3.3 cm mass. There are 2 smaller satellite lesions. She also had hypermetabolic cervical adenopathy. It was noted been resected and were lymphoid hyperplasia rather than metastatic lung cancer. Lymphoma workup was negative.  Based on all of our clinical information her cancer is localized to the left upper lobe. This case has been discussed in [facility] conference and the consensus was to recommend lobectomy.  I  recommended to Lisa Terrell that we proceed with a left VATS, left upper lobectomy and lymph node dissection. We discussed the nature of the operation including the need for general anesthesia, the incisions to be used, and the expected hospital stay and overall recovery. We discussed the indications, risks, benefits, and alternatives (chemotherapy and radiation). She understands the risk include, but are not limited to death, MI, DVT, PE, stroke, bleeding, possibly transfusion, infection, prolonged air leak, irregular heart rhythms, pleural effusions, as well as the possibility of unforeseeable complications. She accepts the risks of surgery and wishes to proceed.  Plan: Left VATS, left upper lobectomy, lymph node dissection on Thursday, March 11. She will be admitted on the day of surgery.

## 2014-01-05 ENCOUNTER — Encounter (INDEPENDENT_AMBULATORY_CARE_PROVIDER_SITE_OTHER): Payer: Self-pay

## 2014-01-05 ENCOUNTER — Encounter (HOSPITAL_COMMUNITY): Payer: Self-pay

## 2014-01-05 ENCOUNTER — Other Ambulatory Visit (HOSPITAL_COMMUNITY): Payer: Self-pay | Admitting: *Deleted

## 2014-01-05 ENCOUNTER — Ambulatory Visit (HOSPITAL_COMMUNITY)
Admission: RE | Admit: 2014-01-05 | Discharge: 2014-01-05 | Disposition: A | Discharge status: Home / Self Care | Payer: 59 | Source: Ambulatory Visit | Attending: Thoracic Surgery (Cardiothoracic Vascular Surgery) | Admitting: Thoracic Surgery (Cardiothoracic Vascular Surgery)

## 2014-01-05 ENCOUNTER — Encounter (HOSPITAL_COMMUNITY)
Admission: RE | Admit: 2014-01-05 | Discharge: 2014-01-05 | Disposition: A | Discharge status: Home / Self Care | Payer: 59 | Source: Ambulatory Visit | Attending: Thoracic Surgery (Cardiothoracic Vascular Surgery) | Admitting: Thoracic Surgery (Cardiothoracic Vascular Surgery)

## 2014-01-05 VITALS — BP 127/79 | HR 53 | Temp 98.9°F | Ht 66.0 in | Wt 175.3 lb

## 2014-01-05 DIAGNOSIS — C3490 Malignant neoplasm of unspecified part of unspecified bronchus or lung: Secondary | ICD-10-CM | POA: Insufficient documentation

## 2014-01-05 DIAGNOSIS — C3492 Malignant neoplasm of unspecified part of left bronchus or lung: Secondary | ICD-10-CM

## 2014-01-05 HISTORY — DX: Dermatitis, unspecified: L30.9

## 2014-01-05 LAB — CBC
HEMATOCRIT: 35.9 % — AB (ref 36.0–46.0)
Hemoglobin: 12.3 g/dL (ref 12.0–15.0)
MCH: 30.5 pg (ref 26.0–34.0)
MCHC: 34.3 g/dL (ref 30.0–36.0)
MCV: 89.1 fL (ref 78.0–100.0)
PLATELETS: 175 10*3/uL (ref 150–400)
RBC: 4.03 MIL/uL (ref 3.87–5.11)
RDW: 13.6 % (ref 11.5–15.5)
WBC: 5.1 10*3/uL (ref 4.0–10.5)

## 2014-01-05 LAB — APTT: APTT: 26 s (ref 24–37)

## 2014-01-05 LAB — URINALYSIS, ROUTINE W REFLEX MICROSCOPIC
BILIRUBIN URINE: NEGATIVE
Glucose, UA: NEGATIVE mg/dL
Hgb urine dipstick: NEGATIVE
Ketones, ur: NEGATIVE mg/dL
Leukocytes, UA: NEGATIVE
Nitrite: NEGATIVE
Protein, ur: NEGATIVE mg/dL
SPECIFIC GRAVITY, URINE: 1.024 (ref 1.005–1.030)
UROBILINOGEN UA: 0.2 mg/dL (ref 0.0–1.0)
pH: 5 (ref 5.0–8.0)

## 2014-01-05 LAB — SURGICAL PCR SCREEN
MRSA, PCR: NEGATIVE
STAPHYLOCOCCUS AUREUS: NEGATIVE

## 2014-01-05 LAB — COMPREHENSIVE METABOLIC PANEL
ALBUMIN: 3.6 g/dL (ref 3.5–5.2)
ALK PHOS: 73 U/L (ref 39–117)
ALT: 19 U/L (ref 0–35)
AST: 20 U/L (ref 0–37)
BUN: 17 mg/dL (ref 6–23)
CO2: 21 mEq/L (ref 19–32)
Calcium: 9.3 mg/dL (ref 8.4–10.5)
Chloride: 106 mEq/L (ref 96–112)
Creatinine, Ser: 0.88 mg/dL (ref 0.50–1.10)
GFR calc Af Amer: 80 mL/min — ABNORMAL LOW (ref >90)
GFR calc non Af Amer: 69 mL/min — ABNORMAL LOW (ref >90)
Glucose, Bld: 82 mg/dL (ref 70–99)
POTASSIUM: 4.1 meq/L (ref 3.7–5.3)
Sodium: 140 mEq/L (ref 137–147)
TOTAL PROTEIN: 7.2 g/dL (ref 6.0–8.3)
Total Bilirubin: 0.2 mg/dL — ABNORMAL LOW (ref 0.3–1.2)

## 2014-01-05 LAB — TYPE AND SCREEN
ABO/RH(D): O POS
ANTIBODY SCREEN: NEGATIVE

## 2014-01-05 LAB — BLOOD GAS, ARTERIAL
Acid-Base Excess: 1.1 mmol/L (ref 0.0–2.0)
Bicarbonate: 24.9 mEq/L — ABNORMAL HIGH (ref 20.0–24.0)
DRAWN BY: 344381
FIO2: 0.21 %
O2 Saturation: 96.4 %
PATIENT TEMPERATURE: 98.6
PCO2 ART: 37.9 mmHg (ref 35.0–45.0)
TCO2: 26.1 mmol/L (ref 0–100)
pH, Arterial: 7.433 (ref 7.350–7.450)
pO2, Arterial: 79 mmHg — ABNORMAL LOW (ref 80.0–100.0)

## 2014-01-05 LAB — PROTIME-INR
INR: 1.08 (ref 0.00–1.49)
PROTHROMBIN TIME: 13.8 s (ref 11.6–15.2)

## 2014-01-05 LAB — ABO/RH: ABO/RH(D): O POS

## 2014-01-05 NOTE — Pre-Procedure Instructions (Signed)
Lisa Terrell  01/05/2014   Your procedure is scheduled on:  Thursday, January 07, 2014 at 8:00 AM.   Report to [facility] Entrance "A" Admitting Office at 6:00 AM.   Call this number if you have problems the morning of surgery: [phone]   Remember:   Do not eat food or drink liquids after midnight Wednesday, 01/06/14.   Take these medicines the morning of surgery with A SIP OF WATER: HYDROcodone-acetaminophen (Lisa Terrell) - if needed.    Do not wear jewelry, make-up or nail polish.  Do not wear lotions, powders, or perfumes. You may NOT wear deodorant.  Do not shave 48 hours prior to surgery.   Do not bring valuables to the hospital.  [facility] is not responsible                  for any belongings or valuables.               Contacts, dentures or bridgework may not be worn into surgery.  Leave suitcase in the car. After surgery it may be brought to your room.  For patients admitted to the hospital, discharge time is determined by your                treatment team.          Special Instructions: [place] - Preparing for Surgery  Before surgery, you can play an important role.  Because skin is not sterile, your skin needs to be as free of germs as possible.  You can reduce the number of germs on you skin by washing with CHG (chlorahexidine gluconate) soap before surgery.  CHG is an antiseptic cleaner which kills germs and bonds with the skin to continue killing germs even after washing.  Please DO NOT use if you have an allergy to CHG or antibacterial soaps.  If your skin becomes reddened/irritated stop using the CHG and inform your nurse when you arrive at Short Stay.  Do not shave (including legs and underarms) for at least 48 hours prior to the first CHG shower.  You may shave your face.  Please follow these instructions carefully:   1.  Shower with CHG Soap the night before surgery and the                                morning of Surgery.  2.  If you choose to  wash your hair, wash your hair first as usual with your       normal shampoo.  3.  After you shampoo, rinse your hair and body thoroughly to remove the                      Shampoo.  4.  Use CHG as you would any other liquid soap.  You can apply chg directly       to the skin and wash gently with scrungie or a clean washcloth.  5.  Apply the CHG Soap to your body ONLY FROM THE NECK DOWN.        Do not use on open wounds or open sores.  Avoid contact with your eyes, ears, mouth and genitals (private parts).  Wash genitals (private parts) with your normal soap.  6.  Wash thoroughly, paying special attention to the area where your surgery        will be performed.  7.  Thoroughly  rinse your body with warm water from the neck down.  8.  DO NOT shower/wash with your normal soap after using and rinsing off       the CHG Soap.  9.  Pat yourself dry with a clean towel.            10.  Wear clean pajamas.            11.  Place clean sheets on your bed the night of your first shower and do not        sleep with pets.  Day of Surgery  Do not apply any lotions/deodorants the morning of surgery.  Please wear clean clothes to the hospital/surgery center.     Please read over the following fact sheets that you were given: Pain Booklet, Coughing and Deep Breathing, Blood Transfusion Information, MRSA Information and Surgical Site Infection Prevention

## 2014-01-05 NOTE — Progress Notes (Signed)
Pt has CHG surgical scrub at home, did not give her another one today.

## 2014-01-06 ENCOUNTER — Encounter (HOSPITAL_COMMUNITY): Payer: Self-pay | Admitting: Pharmacy Technician

## 2014-01-06 MED ORDER — CEFUROXIME SODIUM 1.5 G IJ SOLR
1.5000 g | INTRAMUSCULAR | Status: AC
  Administered 2014-01-07: 1.5 g via INTRAVENOUS
  Filled 2014-01-06: qty 1.5

## 2014-01-07 ENCOUNTER — Encounter (HOSPITAL_COMMUNITY): Payer: 59 | Admitting: Critical Care Medicine

## 2014-01-07 ENCOUNTER — Encounter (HOSPITAL_COMMUNITY)
Admission: RE | Disposition: A | Discharge status: Home / Self Care | Payer: Self-pay | Source: Ambulatory Visit | Attending: Thoracic Surgery (Cardiothoracic Vascular Surgery)

## 2014-01-07 ENCOUNTER — Inpatient Hospital Stay (HOSPITAL_COMMUNITY): Payer: 59

## 2014-01-07 ENCOUNTER — Encounter (HOSPITAL_COMMUNITY): Payer: Self-pay | Admitting: *Deleted

## 2014-01-07 ENCOUNTER — Inpatient Hospital Stay (HOSPITAL_COMMUNITY)
Admission: RE | Admit: 2014-01-07 | Discharge: 2014-01-12 | DRG: 164 | Disposition: A | Discharge status: Home / Self Care | Payer: 59 | Source: Ambulatory Visit | Attending: Thoracic Surgery (Cardiothoracic Vascular Surgery) | Admitting: Thoracic Surgery (Cardiothoracic Vascular Surgery)

## 2014-01-07 ENCOUNTER — Inpatient Hospital Stay (HOSPITAL_COMMUNITY): Payer: 59 | Admitting: Critical Care Medicine

## 2014-01-07 DIAGNOSIS — K5900 Constipation, unspecified: Secondary | ICD-10-CM | POA: Diagnosis not present

## 2014-01-07 DIAGNOSIS — D62 Acute posthemorrhagic anemia: Secondary | ICD-10-CM | POA: Diagnosis not present

## 2014-01-07 DIAGNOSIS — Z902 Acquired absence of lung [part of]: Secondary | ICD-10-CM

## 2014-01-07 DIAGNOSIS — C3410 Malignant neoplasm of upper lobe, unspecified bronchus or lung: Principal | ICD-10-CM | POA: Diagnosis present

## 2014-01-07 DIAGNOSIS — C3492 Malignant neoplasm of unspecified part of left bronchus or lung: Secondary | ICD-10-CM

## 2014-01-07 DIAGNOSIS — F17200 Nicotine dependence, unspecified, uncomplicated: Secondary | ICD-10-CM | POA: Diagnosis present

## 2014-01-07 DIAGNOSIS — J9819 Other pulmonary collapse: Secondary | ICD-10-CM | POA: Diagnosis not present

## 2014-01-07 DIAGNOSIS — C3490 Malignant neoplasm of unspecified part of unspecified bronchus or lung: Secondary | ICD-10-CM

## 2014-01-07 DIAGNOSIS — J9382 Other air leak: Secondary | ICD-10-CM | POA: Diagnosis not present

## 2014-01-07 DIAGNOSIS — I10 Essential (primary) hypertension: Secondary | ICD-10-CM | POA: Diagnosis present

## 2014-01-07 HISTORY — PX: VIDEO ASSISTED THORACOSCOPY (VATS)/ LOBECTOMY: SHX6169

## 2014-01-07 HISTORY — PX: LYMPH NODE DISSECTION: SHX5087

## 2014-01-07 SURGERY — VIDEO ASSISTED THORACOSCOPY (VATS)/ LOBECTOMY
Anesthesia: General | Site: Chest | Laterality: Left

## 2014-01-07 MED ORDER — ONDANSETRON HCL 4 MG/2ML IJ SOLN
INTRAMUSCULAR | Status: DC | PRN
  Administered 2014-01-07: 4 mg via INTRAVENOUS

## 2014-01-07 MED ORDER — ROCURONIUM BROMIDE 100 MG/10ML IV SOLN
INTRAVENOUS | Status: DC | PRN
  Administered 2014-01-07: 5 mg via INTRAVENOUS
  Administered 2014-01-07: 10 mg via INTRAVENOUS
  Administered 2014-01-07: 5 mg via INTRAVENOUS
  Administered 2014-01-07: 10 mg via INTRAVENOUS
  Administered 2014-01-07: 5 mg via INTRAVENOUS
  Administered 2014-01-07 (×3): 10 mg via INTRAVENOUS
  Administered 2014-01-07: 50 mg via INTRAVENOUS

## 2014-01-07 MED ORDER — DEXTROSE-NACL 5-0.9 % IV SOLN
INTRAVENOUS | Status: DC
  Administered 2014-01-07: 23:00:00 via INTRAVENOUS
  Administered 2014-01-07: 125 mL/h via INTRAVENOUS
  Administered 2014-01-08: via INTRAVENOUS
  Administered 2014-01-08: 75 mL/h via INTRAVENOUS
  Administered 2014-01-09: 13:00:00 via INTRAVENOUS
  Administered 2014-01-10: 75 mL/h via INTRAVENOUS

## 2014-01-07 MED ORDER — DIPHENHYDRAMINE HCL 50 MG/ML IJ SOLN
12.5000 mg | Freq: Four times a day (QID) | INTRAMUSCULAR | Status: DC | PRN
  Filled 2014-01-07: qty 0.25

## 2014-01-07 MED ORDER — FENTANYL 10 MCG/ML IV SOLN
INTRAVENOUS | Status: DC
  Administered 2014-01-07: 14:00:00 via INTRAVENOUS
  Administered 2014-01-07: 90 ug via INTRAVENOUS
  Administered 2014-01-07: 105 ug via INTRAVENOUS
  Administered 2014-01-08: 67 ug via INTRAVENOUS
  Administered 2014-01-08: 45 ug via INTRAVENOUS
  Administered 2014-01-08: 60 ug via INTRAVENOUS
  Administered 2014-01-08: 10:00:00 via INTRAVENOUS
  Administered 2014-01-08: 90 ug via INTRAVENOUS
  Administered 2014-01-08: 60 ug via INTRAVENOUS
  Administered 2014-01-08: 75 ug via INTRAVENOUS
  Administered 2014-01-09: 45 ug via INTRAVENOUS
  Administered 2014-01-09: 21:00:00 via INTRAVENOUS
  Administered 2014-01-09 (×2): 75 ug via INTRAVENOUS
  Administered 2014-01-09: 90 ug via INTRAVENOUS
  Administered 2014-01-09: 15 ug via INTRAVENOUS
  Administered 2014-01-09: 30 ug via INTRAVENOUS
  Administered 2014-01-10: 45 ug via INTRAVENOUS
  Administered 2014-01-10: 30 ug via INTRAVENOUS
  Administered 2014-01-10: 15 ug via INTRAVENOUS
  Administered 2014-01-10: 30 ug via INTRAVENOUS
  Filled 2014-01-07 (×3): qty 50

## 2014-01-07 MED ORDER — GLYCOPYRROLATE 0.2 MG/ML IJ SOLN
INTRAMUSCULAR | Status: AC
  Filled 2014-01-07: qty 3

## 2014-01-07 MED ORDER — OXYCODONE HCL 5 MG PO TABS
ORAL_TABLET | ORAL | Status: AC
  Filled 2014-01-07: qty 1

## 2014-01-07 MED ORDER — PROPOFOL 10 MG/ML IV BOLUS
INTRAVENOUS | Status: AC
  Filled 2014-01-07: qty 20

## 2014-01-07 MED ORDER — EPHEDRINE SULFATE 50 MG/ML IJ SOLN
INTRAMUSCULAR | Status: AC
  Filled 2014-01-07: qty 1

## 2014-01-07 MED ORDER — OXYCODONE HCL 5 MG PO TABS
5.0000 mg | ORAL_TABLET | ORAL | Status: AC | PRN
  Administered 2014-01-08: 10 mg via ORAL
  Filled 2014-01-07: qty 2

## 2014-01-07 MED ORDER — ONDANSETRON HCL 4 MG/2ML IJ SOLN
4.0000 mg | Freq: Four times a day (QID) | INTRAMUSCULAR | Status: DC | PRN
  Filled 2014-01-07: qty 2

## 2014-01-07 MED ORDER — LACTATED RINGERS IV SOLN
INTRAVENOUS | Status: DC | PRN
  Administered 2014-01-07: 07:00:00 via INTRAVENOUS

## 2014-01-07 MED ORDER — FENTANYL CITRATE 0.05 MG/ML IJ SOLN
25.0000 ug | INTRAMUSCULAR | Status: DC | PRN
  Administered 2014-01-07: 50 ug via INTRAVENOUS

## 2014-01-07 MED ORDER — FENTANYL CITRATE 0.05 MG/ML IJ SOLN
INTRAMUSCULAR | Status: DC | PRN
  Administered 2014-01-07: 25 ug via INTRAVENOUS
  Administered 2014-01-07: 50 ug via INTRAVENOUS
  Administered 2014-01-07: 25 ug via INTRAVENOUS
  Administered 2014-01-07: 50 ug via INTRAVENOUS
  Administered 2014-01-07: 25 ug via INTRAVENOUS
  Administered 2014-01-07 (×2): 50 ug via INTRAVENOUS
  Administered 2014-01-07: 25 ug via INTRAVENOUS
  Administered 2014-01-07 (×3): 50 ug via INTRAVENOUS

## 2014-01-07 MED ORDER — SENNOSIDES-DOCUSATE SODIUM 8.6-50 MG PO TABS
1.0000 | ORAL_TABLET | Freq: Every evening | ORAL | Status: DC | PRN
  Administered 2014-01-09: 1 via ORAL
  Filled 2014-01-07 (×2): qty 1

## 2014-01-07 MED ORDER — SODIUM CHLORIDE 0.9 % IJ SOLN
INTRAMUSCULAR | Status: AC
  Filled 2014-01-07: qty 10

## 2014-01-07 MED ORDER — FENTANYL CITRATE 0.05 MG/ML IJ SOLN
INTRAMUSCULAR | Status: AC
  Filled 2014-01-07: qty 2

## 2014-01-07 MED ORDER — DIPHENHYDRAMINE HCL 12.5 MG/5ML PO ELIX
12.5000 mg | ORAL_SOLUTION | Freq: Four times a day (QID) | ORAL | Status: DC | PRN
Start: 2014-01-07 — End: 2014-01-10
  Filled 2014-01-07: qty 5

## 2014-01-07 MED ORDER — SODIUM CHLORIDE 0.9 % IJ SOLN
9.0000 mL | INTRAMUSCULAR | Status: DC | PRN
Start: 2014-01-07 — End: 2014-01-10
  Administered 2014-01-09: 13:00:00 via INTRAVENOUS

## 2014-01-07 MED ORDER — ONDANSETRON HCL 4 MG/2ML IJ SOLN: 4.0000 mg | Freq: Once | INTRAMUSCULAR | Status: DC | PRN

## 2014-01-07 MED ORDER — DEXTROSE 5 % IV SOLN
1.5000 g | Freq: Two times a day (BID) | INTRAVENOUS | Status: AC
  Administered 2014-01-07 – 2014-01-08 (×2): 1.5 g via INTRAVENOUS
  Filled 2014-01-07 (×2): qty 1.5

## 2014-01-07 MED ORDER — NEOSTIGMINE METHYLSULFATE 1 MG/ML IJ SOLN
INTRAMUSCULAR | Status: DC | PRN
  Administered 2014-01-07: 4 mg via INTRAVENOUS

## 2014-01-07 MED ORDER — ACETAMINOPHEN 160 MG/5ML PO SOLN
325.0000 mg | ORAL | Status: DC | PRN
  Filled 2014-01-07: qty 20.3

## 2014-01-07 MED ORDER — ACETAMINOPHEN 160 MG/5ML PO SOLN
1000.0000 mg | Freq: Four times a day (QID) | ORAL | Status: DC
Start: 2014-01-07 — End: 2014-01-08

## 2014-01-07 MED ORDER — 0.9 % SODIUM CHLORIDE (POUR BTL) OPTIME
TOPICAL | Status: DC | PRN
  Administered 2014-01-07: 1000 mL

## 2014-01-07 MED ORDER — ALBUTEROL SULFATE (2.5 MG/3ML) 0.083% IN NEBU
2.5000 mg | INHALATION_SOLUTION | RESPIRATORY_TRACT | Status: DC
  Administered 2014-01-07 – 2014-01-08 (×2): 2.5 mg via RESPIRATORY_TRACT
  Filled 2014-01-07 (×2): qty 3

## 2014-01-07 MED ORDER — NALOXONE HCL 0.4 MG/ML IJ SOLN
0.4000 mg | INTRAMUSCULAR | Status: DC | PRN
  Filled 2014-01-07: qty 1

## 2014-01-07 MED ORDER — ROCURONIUM BROMIDE 50 MG/5ML IV SOLN
INTRAVENOUS | Status: AC
  Filled 2014-01-07: qty 1

## 2014-01-07 MED ORDER — BISACODYL 5 MG PO TBEC
10.0000 mg | DELAYED_RELEASE_TABLET | Freq: Every day | ORAL | Status: DC
  Administered 2014-01-08 – 2014-01-09 (×2): 10 mg via ORAL
  Filled 2014-01-07 (×3): qty 2

## 2014-01-07 MED ORDER — POTASSIUM CHLORIDE 10 MEQ/50ML IV SOLN
10.0000 meq | Freq: Every day | INTRAVENOUS | Status: DC | PRN
  Administered 2014-01-08 (×3): 10 meq via INTRAVENOUS
  Filled 2014-01-07 (×2): qty 50

## 2014-01-07 MED ORDER — OXYCODONE HCL 5 MG PO TABS
5.0000 mg | ORAL_TABLET | Freq: Once | ORAL | Status: AC | PRN
  Administered 2014-01-07: 5 mg via ORAL

## 2014-01-07 MED ORDER — HEMOSTATIC AGENTS (NO CHARGE) OPTIME
TOPICAL | Status: DC | PRN
  Administered 2014-01-07: 1 via TOPICAL

## 2014-01-07 MED ORDER — MIDAZOLAM HCL 2 MG/2ML IJ SOLN
INTRAMUSCULAR | Status: AC
  Filled 2014-01-07: qty 2

## 2014-01-07 MED ORDER — NEOSTIGMINE METHYLSULFATE 1 MG/ML IJ SOLN
INTRAMUSCULAR | Status: AC
  Filled 2014-01-07: qty 10

## 2014-01-07 MED ORDER — OXYCODONE-ACETAMINOPHEN 5-325 MG PO TABS: 1.0000 | ORAL_TABLET | ORAL | Status: DC | PRN

## 2014-01-07 MED ORDER — ONDANSETRON HCL 4 MG/2ML IJ SOLN
INTRAMUSCULAR | Status: AC
  Filled 2014-01-07: qty 2

## 2014-01-07 MED ORDER — ACETAMINOPHEN 500 MG PO TABS
1000.0000 mg | ORAL_TABLET | Freq: Four times a day (QID) | ORAL | Status: DC
  Administered 2014-01-07 – 2014-01-08 (×3): 1000 mg via ORAL
  Filled 2014-01-07: qty 2

## 2014-01-07 MED ORDER — FENTANYL CITRATE 0.05 MG/ML IJ SOLN
INTRAMUSCULAR | Status: AC
  Filled 2014-01-07: qty 5

## 2014-01-07 MED ORDER — OXYCODONE-ACETAMINOPHEN 5-325 MG PO TABS
1.0000 | ORAL_TABLET | ORAL | Status: DC | PRN
  Administered 2014-01-09 – 2014-01-12 (×12): 2 via ORAL
  Filled 2014-01-07 (×12): qty 2

## 2014-01-07 MED ORDER — BUPIVACAINE 0.5 % ON-Q PUMP SINGLE CATH 400 ML
INJECTION | Status: DC | PRN
  Administered 2014-01-07: 400 mL

## 2014-01-07 MED ORDER — ONDANSETRON HCL 4 MG/2ML IJ SOLN: 4.0000 mg | Freq: Four times a day (QID) | INTRAMUSCULAR | Status: DC | PRN

## 2014-01-07 MED ORDER — LACTATED RINGERS IV SOLN
INTRAVENOUS | Status: DC | PRN
Start: 2014-01-07 — End: 2014-01-07
  Administered 2014-01-07 (×2): via INTRAVENOUS

## 2014-01-07 MED ORDER — KETOROLAC TROMETHAMINE 30 MG/ML IJ SOLN
15.0000 mg | Freq: Once | INTRAMUSCULAR | Status: AC | PRN
  Administered 2014-01-07: 30 mg via INTRAVENOUS

## 2014-01-07 MED ORDER — GLYCOPYRROLATE 0.2 MG/ML IJ SOLN
INTRAMUSCULAR | Status: DC | PRN
  Administered 2014-01-07: 0.6 mg via INTRAVENOUS

## 2014-01-07 MED ORDER — KETOROLAC TROMETHAMINE 30 MG/ML IJ SOLN
INTRAMUSCULAR | Status: AC
  Filled 2014-01-07: qty 1

## 2014-01-07 MED ORDER — OXYCODONE HCL 5 MG/5ML PO SOLN: 5.0000 mg | Freq: Once | ORAL | Status: AC | PRN

## 2014-01-07 MED ORDER — MIDAZOLAM HCL 5 MG/5ML IJ SOLN
INTRAMUSCULAR | Status: DC | PRN
  Administered 2014-01-07: 2 mg via INTRAVENOUS

## 2014-01-07 MED ORDER — PROPOFOL 10 MG/ML IV BOLUS
INTRAVENOUS | Status: DC | PRN
  Administered 2014-01-07: 100 mg via INTRAVENOUS

## 2014-01-07 MED ORDER — EPHEDRINE SULFATE 50 MG/ML IJ SOLN
INTRAMUSCULAR | Status: DC | PRN
  Administered 2014-01-07 (×3): 5 mg via INTRAVENOUS

## 2014-01-07 MED ORDER — ACETAMINOPHEN 325 MG PO TABS: 325.0000 mg | ORAL_TABLET | ORAL | Status: DC | PRN

## 2014-01-07 MED ORDER — ARTIFICIAL TEARS OP OINT
TOPICAL_OINTMENT | OPHTHALMIC | Status: AC
  Filled 2014-01-07: qty 3.5

## 2014-01-07 MED ORDER — PNEUMOCOCCAL VAC POLYVALENT 25 MCG/0.5ML IJ INJ: 0.5000 mL | INJECTION | INTRAMUSCULAR | Status: DC | PRN

## 2014-01-07 MED ORDER — BUPIVACAINE 0.5 % ON-Q PUMP SINGLE CATH 400 ML
400.0000 mL | INJECTION | Status: DC
  Filled 2014-01-07: qty 400

## 2014-01-07 MED ORDER — TRAMADOL HCL 50 MG PO TABS: 50.0000 mg | ORAL_TABLET | Freq: Four times a day (QID) | ORAL | Status: DC | PRN

## 2014-01-07 MED ORDER — LIDOCAINE HCL (CARDIAC) 20 MG/ML IV SOLN
INTRAVENOUS | Status: AC
  Filled 2014-01-07: qty 5

## 2014-01-07 SURGICAL SUPPLY — 87 items
APPLIER CLIP ROT 10 11.4 M/L (STAPLE) ×4
BLADE SURG 11 STRL SS (BLADE) ×4 IMPLANT
CANISTER SUCTION 2500CC (MISCELLANEOUS) ×4 IMPLANT
CATH FOLEY 2WAY SLVR  5CC 12FR (CATHETERS) ×2
CATH FOLEY 2WAY SLVR 5CC 12FR (CATHETERS) ×2 IMPLANT
CATH KIT ON Q 5IN SLV (PAIN MANAGEMENT) IMPLANT
CATH THORACIC 28FR (CATHETERS) ×4 IMPLANT
CATH THORACIC 28FR RT ANG (CATHETERS) ×4 IMPLANT
CATH THORACIC 36FR (CATHETERS) IMPLANT
CATH THORACIC 36FR RT ANG (CATHETERS) ×4 IMPLANT
CLIP APPLIE ROT 10 11.4 M/L (STAPLE) ×2 IMPLANT
CLIP TI MEDIUM 24 (CLIP) ×4 IMPLANT
CLIP TI MEDIUM 6 (CLIP) ×4 IMPLANT
CONN ST 1/4X3/8  BEN (MISCELLANEOUS) ×2
CONN ST 1/4X3/8 BEN (MISCELLANEOUS) ×2 IMPLANT
CONN Y 3/8X3/8X3/8  BEN (MISCELLANEOUS) ×4
CONN Y 3/8X3/8X3/8 BEN (MISCELLANEOUS) ×4 IMPLANT
CONT SPEC 4OZ CLIKSEAL STRL BL (MISCELLANEOUS) ×16 IMPLANT
COVER SURGICAL LIGHT HANDLE (MISCELLANEOUS) ×4 IMPLANT
DERMABOND ADVANCED (GAUZE/BANDAGES/DRESSINGS)
DERMABOND ADVANCED .7 DNX12 (GAUZE/BANDAGES/DRESSINGS) IMPLANT
DRAPE LAPAROSCOPIC ABDOMINAL (DRAPES) ×4 IMPLANT
DRAPE WARM FLUID 44X44 (DRAPE) ×4 IMPLANT
ELECT REM PT RETURN 9FT ADLT (ELECTROSURGICAL) ×4
ELECTRODE REM PT RTRN 9FT ADLT (ELECTROSURGICAL) ×2 IMPLANT
GLOVE BIOGEL M 6.5 STRL (GLOVE) ×4 IMPLANT
GLOVE BIOGEL M STER SZ 6 (GLOVE) ×16 IMPLANT
GLOVE BIOGEL M STRL SZ7.5 (GLOVE) ×4 IMPLANT
GLOVE BIOGEL PI IND STRL 7.0 (GLOVE) ×8 IMPLANT
GLOVE BIOGEL PI INDICATOR 7.0 (GLOVE) ×8
GLOVE SURG SIGNA 7.5 PF LTX (GLOVE) ×8 IMPLANT
GOWN STRL REUS W/ TWL LRG LVL3 (GOWN DISPOSABLE) ×10 IMPLANT
GOWN STRL REUS W/ TWL XL LVL3 (GOWN DISPOSABLE) ×2 IMPLANT
GOWN STRL REUS W/TWL LRG LVL3 (GOWN DISPOSABLE) ×10
GOWN STRL REUS W/TWL XL LVL3 (GOWN DISPOSABLE) ×2
HANDLE STAPLE ENDO GIA SHORT (STAPLE) ×2
HEMOSTAT SURGICEL 2X14 (HEMOSTASIS) IMPLANT
KIT BASIN OR (CUSTOM PROCEDURE TRAY) ×4 IMPLANT
KIT ROOM TURNOVER OR (KITS) ×4 IMPLANT
KIT SUCTION CATH 14FR (SUCTIONS) ×4 IMPLANT
NS IRRIG 1000ML POUR BTL (IV SOLUTION) ×8 IMPLANT
PACK CHEST (CUSTOM PROCEDURE TRAY) ×4 IMPLANT
PAD ARMBOARD 7.5X6 YLW CONV (MISCELLANEOUS) ×8 IMPLANT
PAIN PUMP ON-Q 400MLX5ML 5IN (MISCELLANEOUS) ×4 IMPLANT
POUCH ENDO CATCH II 15MM (MISCELLANEOUS) IMPLANT
POUCH SPECIMEN RETRIEVAL 10MM (ENDOMECHANICALS) IMPLANT
RELOAD 45 THICK GREEN (ENDOMECHANICALS) ×4 IMPLANT
RELOAD EGIA 45 MED/THCK PURPLE (STAPLE) ×8 IMPLANT
RELOAD EGIA 45 TAN VASC (STAPLE) ×4 IMPLANT
RELOAD EGIA TRIS TAN 45 CVD (STAPLE) ×20 IMPLANT
SEALANT PROGEL (MISCELLANEOUS) IMPLANT
SEALANT SURG COSEAL 4ML (VASCULAR PRODUCTS) IMPLANT
SEALANT SURG COSEAL 8ML (VASCULAR PRODUCTS) IMPLANT
SOLUTION ANTI FOG 6CC (MISCELLANEOUS) ×12 IMPLANT
SPECIMEN JAR MEDIUM (MISCELLANEOUS) ×4 IMPLANT
SPONGE GAUZE 4X4 12PLY (GAUZE/BANDAGES/DRESSINGS) ×4 IMPLANT
SPONGE GAUZE 4X4 12PLY STER LF (GAUZE/BANDAGES/DRESSINGS) ×4 IMPLANT
SPONGE INTESTINAL PEANUT (DISPOSABLE) ×4 IMPLANT
STAPLER ENDO GIA 12MM SHORT (STAPLE) ×2 IMPLANT
STAPLER ENDO NO KNIFE (STAPLE) ×4 IMPLANT
SUT PROLENE 4 0 RB 1 (SUTURE) ×2
SUT PROLENE 4-0 RB1 .5 CRCL 36 (SUTURE) ×2 IMPLANT
SUT SILK  1 MH (SUTURE) ×4
SUT SILK 1 MH (SUTURE) ×4 IMPLANT
SUT SILK 2 0SH CR/8 30 (SUTURE) IMPLANT
SUT SILK 3 0SH CR/8 30 (SUTURE) ×4 IMPLANT
SUT VIC AB 1 CTX 36 (SUTURE) ×2
SUT VIC AB 1 CTX36XBRD ANBCTR (SUTURE) ×2 IMPLANT
SUT VIC AB 2-0 CTX 36 (SUTURE) ×4 IMPLANT
SUT VIC AB 2-0 UR6 27 (SUTURE) IMPLANT
SUT VIC AB 3-0 MH 27 (SUTURE) IMPLANT
SUT VIC AB 3-0 X1 27 (SUTURE) ×8 IMPLANT
SUT VICRYL 2 TP 1 (SUTURE) IMPLANT
SWAB COLLECTION DEVICE MRSA (MISCELLANEOUS) IMPLANT
SYSTEM SAHARA CHEST DRAIN ATS (WOUND CARE) ×4 IMPLANT
TAPE CLOTH 4X10 WHT NS (GAUZE/BANDAGES/DRESSINGS) ×4 IMPLANT
TAPE CLOTH SURG 4X10 WHT LF (GAUZE/BANDAGES/DRESSINGS) ×4 IMPLANT
TIP APPLICATOR SPRAY EXTEND 16 (VASCULAR PRODUCTS) IMPLANT
TOWEL OR 17X24 6PK STRL BLUE (TOWEL DISPOSABLE) ×4 IMPLANT
TOWEL OR 17X26 10 PK STRL BLUE (TOWEL DISPOSABLE) ×4 IMPLANT
TRAP SPECIMEN MUCOUS 40CC (MISCELLANEOUS) IMPLANT
TRAY FOLEY CATH 16FRSI W/METER (SET/KITS/TRAYS/PACK) ×4 IMPLANT
TROCAR XCEL BLADELESS 5X75MML (TROCAR) ×4 IMPLANT
TUBE ANAEROBIC SPECIMEN COL (MISCELLANEOUS) IMPLANT
TUNNELER SHEATH ON-Q 11GX8 (MISCELLANEOUS) ×4 IMPLANT
TUNNELER SHEATH ON-Q 11GX8 DSP (PAIN MANAGEMENT) IMPLANT
WATER STERILE IRR 1000ML POUR (IV SOLUTION) ×8 IMPLANT

## 2014-01-07 NOTE — Transfer of Care (Signed)
Immediate Anesthesia Transfer of Care Note  Patient: Lisa Terrell  Procedure(s) Performed: Procedure(s) with comments: VIDEO ASSISTED THORACOSCOPY (VATS)/ UPPER LOBECTOMY (Left) - LEFT VATS/LUL LOBECTOMY LYMPH NODE DISSECTION (Left)  Patient Location: PACU  Anesthesia Type:General  Level of Consciousness: awake, alert  and oriented  Airway & Oxygen Therapy: Patient Spontanous Breathing and Patient connected to nasal cannula oxygen  Post-op Assessment: Report given to PACU RN, Post -op Vital signs reviewed and stable and Patient moving all extremities X 4  Post vital signs: Reviewed and stable  Complications: No apparent anesthesia complications

## 2014-01-07 NOTE — Anesthesia Procedure Notes (Signed)
Procedure Name: Intubation Date/Time: 01/07/2014 8:05 AM Performed by: Carola Frost Pre-anesthesia Checklist: Patient identified, Timeout performed, Emergency Drugs available, Suction available and Patient being monitored Patient Re-evaluated:Patient Re-evaluated prior to inductionOxygen Delivery Method: Circle system utilized Preoxygenation: Pre-oxygenation with 100% oxygen Intubation Type: IV induction Ventilation: Mask ventilation without difficulty Laryngoscope Size: Mac and 3 Grade View: Grade I Endobronchial tube: Double lumen EBT and Left and 37 Fr Number of attempts: 1 Airway Equipment and Method: Stylet Placement Confirmation: positive ETCO2,  ETT inserted through vocal cords under direct vision and breath sounds checked- equal and bilateral Secured at: 29 cm Tube secured with: Tape Dental Injury: Teeth and Oropharynx as per pre-operative assessment

## 2014-01-07 NOTE — H&P (View-Only) (Signed)
HPI:  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  [name] is a 63 year old smoker (1 pack a day for 30 years) who about 6 months ago noticed a "knot" in her neck. She wasn't having any pain, fever, chills, difficulty swallowing or shortness of breath. She didn't pay much attention to it for a while, however finally she became concerned and went to Dr. Legrand Terrell. She eventually was referred to Dr. Romona Terrell and then finally to Dr. Benjamine Terrell. An MRI was done of the neck which showed a 2.8 cm left suprahilar lung mass with multiple level I neck nodes measuring 1.8 and 1.4 cm. There was a 1.5 cm left parotid mass. She was also noted to have a 2.8 cm suprahilar lung mass.   A PET CT was done which showed these lesions to be hypermetabolic.   I did bronchoscopy and biopsy of the left upper lobe mass on 11/20/2013 which was positive for squamous cell carcinoma.  She had an aspiration of a neck lymph node by Dr. Benjamine Terrell which was unremarkable. She then had the nodes resected on 12/01/2013. Pathology showed lymphoid hyperplasia.  She missed her appointment last week due to the weather.  She continues to smoke a pack a day. She denies any chest pain, pressure, tightness, or shortness of breath. She has had a nonproductive cough but no hemoptysis. She denieswheezing. Her weight has been stable. She continues to work.  ECOG/ZUBROD= 0   Past Medical History  Diagnosis Date  . Hypertension   . Mass of submandibular region 10/02/13    TWO PALPABLE LEFT MASSES NOTED  . Mass of lung PER PET 10/29/13    LEFT UPPER LOBE  . Lung nodules 11/04/13 PER PET    MEDIAL LEFT UPPER LOBE  . Varicose veins    Past Surgical History  Procedure Laterality Date  . Video bronchoscopy with endobronchial navigation N/A 11/19/2013    Procedure: VIDEO BRONCHOSCOPY WITH ENDOBRONCHIAL NAVIGATION;  Surgeon: Lisa Nakayama, MD;  Location: [facility];  Service: Thoracic;  Laterality:  N/A;  . Endobronchial ultrasound N/A 11/19/2013    Procedure: ENDOBRONCHIAL ULTRASOUND;  Surgeon: Lisa Nakayama, MD;  Location: [facility];  Service: Thoracic;  Laterality: N/A;  . Hammer toe surgery  2004    right  . Tubal ligation    . Mass excision N/A 12/01/2013    Procedure: EXCISION NECK MASS--SUBMANDIBULAR;  Surgeon: Lisa Dike, MD;  Location: [facility] [place];  Service: ENT;  Laterality: N/A;    Current Outpatient Prescriptions  Medication Sig Dispense Refill  . ALOE VERA PO Take 1 capsule by mouth daily.      Marland Kitchen HYDROcodone-acetaminophen (NORCO) 7.5-325 MG per tablet Take 1 tablet by mouth every 6 (six) hours as needed for moderate pain.  15 tablet  0  . lisinopril-hydrochlorothiazide (PRINZIDE,ZESTORETIC) 20-25 MG per tablet Take 1 tablet by mouth daily.       No current facility-administered medications for this visit.    Physical Exam BP 161/88  Pulse 60  Resp 20  Ht 5\' 6"  (1.676 m)  Wt 172 lb (78.019 kg)  BMI 27.77 kg/m2  SpO2 98% Well-developed and well-nourished 63 year old woman in no acute distress Neurologic alert and oriented x3 with no focal deficits Neck surgical scar healing well, no palpable adenopathy Cardiac regular rate and rhythm normal S1-S2, no rubs murmurs or gallops Lungs clear with equal breath sounds bilaterally Abdomen soft nontender Extremities without clubbing cyanosis or  edema   Diagnostic Tests: PATH FINAL DIAGNOSIS Diagnosis Lung, biopsy, Left upper lobe - POSITIVE FOR SQUAMOUS CELL CARCINOMA.  CT CHEST WITHOUT CONTRAST  TECHNIQUE:  Multidetector CT imaging of the chest was performed using thin slice  collimation for electromagnetic bronchoscopy planning purposes,  without intravenous contrast.  COMPARISON: PET-CT 11/04/2013.  FINDINGS:  The macro lobulated left suprahilar mass has slightly enlarged,  measuring 3.3 x 3.0 cm on image 15 (previously 3.1 x 2.8 cm). There  is narrowing of the left upper lobe bronchus. The  two adjacent left  upper lobe satellite nodules are stable, measuring 6 mm on image 8  and 7 mm on image 10. No other pulmonary nodules are seen.  There are no pathologically enlarged mediastinal or hilar lymph  nodes. The enlarged left submandibular node is partially imaged,  measuring 1.6 cm on image 2. There is no pleural or pericardial  effusion. A small hiatal hernia is noted.  Visualized upper abdomen appears unremarkable. There are no  worrisome findings.  IMPRESSION:  1. Slight interval enlargement of dominant hypermetabolic left upper  lobe mass, now measuring up to 3.3 cm in diameter.  2. The adjacent left upper lobe satellite nodules and enlarged  submandibular lymph nodes are grossly stable.  Electronically Signed  By: Lisa Terrell M.D.  On: 11/17/2013 13:55  NUCLEAR MEDICINE PET SKULL BASE TO THIGH  FASTING BLOOD GLUCOSE: Value: 80 mg/dl  TECHNIQUE:  16.2 mCi F-18 FDG was injected intravenously. CT data was obtained  and used for attenuation correction and anatomic localization only.  (This was not acquired as a diagnostic CT examination.) Additional  exam technical data entered on technologist worksheet.  COMPARISON: MRI neck dated 09/16/2013  FINDINGS:  NECK  12 mm short axis left submandibular (level IB) node, max SUV 8.8  (neck PET image 63). Adjacent 17 mm short axis left level IB node  (series 2/ image 47), max SUV 13.0 (neck PET image 67).  These findings are grossly unchanged from prior MRI and remain  worrisome for nodal metastases.  CHEST  Macrolobulated 2.8 x 3.1 cm left upper lobe mass (series 2/image  70), corresponding to the prior MRI abnormality, max SUV 15.1. This  appearance is highly suspicious for primary bronchogenic neoplasm.  Two additional satellite nodules in the medial left upper lobe  measuring up to 8 mm (series 2/ images 61 and 64), max SUV 2.5.  Small mediastinal and bilateral axillary nodes measuring up to 8 mm  short axis, non FDG  avid.  ABDOMEN/PELVIS  No abnormal hypermetabolic activity within the liver, pancreas,  adrenal glands, or spleen.  Atherosclerotic calcifications of the abdominal aorta and branch  vessels. Colonic diverticulosis. Left colon is mildly thick-walled  but decompressed.  8 mm short axis right inguinal node (series 2/ image 196), max SUV  6.2.  SKELETON  No focal hypermetabolic activity to suggest skeletal metastasis.  IMPRESSION:  3.1 cm left upper lobe mass, corresponding to the prior MRI  abnormality, max SUV 15.1. This appearance is highly suspicious for  primary bronchogenic neoplasm.  Two satellite nodules in the medial left upper lobe measuring up to  8 mm, max SUV 2.5, suspicious for metastases.  Two left submandibular nodes 1.7 cm short axis, max SUV 13.0. This  appearance is suspicious for nodal metastases, although possibly  related to a nonvisualized primary head/neck cancer. ENT  consultation with visual inspection is suggested.  8 mm short axis right inguinal node, max SUV 6.2. Although the  distribution is unexpected,  given the hypermetabolism, this  appearance is suspicious for nodal metastasis related to primary  lung or head/neck cancer.  Electronically Signed  By: Julian Hy M.D.  On: 11/04/2013 15:24  Pulmonary function testing FVC 2.5 one (92%) FEV1 1.96 (92%) FEV1 post bronchodilator 2.13 (100%) DLCO 81%  Impression: Lisa Terrell is a 63 year old woman with a history of tobacco abuse who has a squamous cell carcinoma of the left upper lobe. This is a 3.3 cm mass. There are 2 smaller satellite lesions. She also had hypermetabolic cervical adenopathy. It was noted been resected and were lymphoid hyperplasia rather than metastatic lung cancer. Lymphoma workup was negative.  Based on all of our clinical information her cancer is localized to the left upper lobe. This case has been discussed in [facility] conference and the consensus was to recommend lobectomy.  I  recommended to Lisa Terrell that we proceed with a left VATS, left upper lobectomy and lymph node dissection. We discussed the nature of the operation including the need for general anesthesia, the incisions to be used, and the expected hospital stay and overall recovery. We discussed the indications, risks, benefits, and alternatives (chemotherapy and radiation). She understands the risk include, but are not limited to death, MI, DVT, PE, stroke, bleeding, possibly transfusion, infection, prolonged air leak, irregular heart rhythms, pleural effusions, as well as the possibility of unforeseeable complications. She accepts the risks of surgery and wishes to proceed.  Plan: Left VATS, left upper lobectomy, lymph node dissection on Thursday, March 11. She will be admitted on the day of surgery.

## 2014-01-07 NOTE — Progress Notes (Signed)
Patient ID: Lisa Terrell, female   DOB: [birth year], 63 y.o.   MRN: [id number]  SICU Evening Rounds:  Hemodynamically stable, sats 96%  Pain under control  Chest tube output low. Small air leak present.  CBC    Component Value Date/Time   WBC 5.1 01/05/2014 1350   RBC 4.03 01/05/2014 1350   HGB 12.3 01/05/2014 1350   HCT 35.9* 01/05/2014 1350   PLT 175 01/05/2014 1350   MCV 89.1 01/05/2014 1350   MCH 30.5 01/05/2014 1350   MCHC 34.3 01/05/2014 1350   RDW 13.6 01/05/2014 1350    BMET    Component Value Date/Time   NA 140 01/05/2014 1350   K 4.1 01/05/2014 1350   CL 106 01/05/2014 1350   CO2 21 01/05/2014 1350   GLUCOSE 82 01/05/2014 1350   BUN 17 01/05/2014 1350   CREATININE 0.88 01/05/2014 1350   CALCIUM 9.3 01/05/2014 1350   GFRNONAA 69* 01/05/2014 1350   GFRAA 80* 01/05/2014 1350    A/P: stable postop course.

## 2014-01-07 NOTE — Anesthesia Preprocedure Evaluation (Addendum)
Anesthesia Evaluation  Patient identified by MRN, date of birth, ID band Patient awake    Reviewed: Allergy & Precautions, H&P , NPO status , Patient's Chart, lab work & pertinent test results  History of Anesthesia Complications Negative for: history of anesthetic complications  Airway Mallampati: II TM Distance: >3 FB Neck ROM: Full    Dental  (+) Teeth Intact, Dental Advisory Given   Pulmonary neg shortness of breath, neg sleep apnea, neg COPDformer smoker,  breath sounds clear to auscultation        Cardiovascular hypertension, Pt. on medications - angina- CAD, - Past MI, - Cardiac Stents and - CHF Rhythm:Regular Rate:Normal     Neuro/Psych negative neurological ROS  negative psych ROS   GI/Hepatic negative GI ROS, Neg liver ROS,   Endo/Other  negative endocrine ROS  Renal/GU negative Renal ROS     Musculoskeletal   Abdominal   Peds  Hematology  (+) anemia ,   Anesthesia Other Findings   Reproductive/Obstetrics                         Anesthesia Physical Anesthesia Plan  ASA: II  Anesthesia Plan: General   Post-op Pain Management:    Induction: Intravenous  Airway Management Planned: Double Lumen EBT  Additional Equipment: Arterial line and CVP  Intra-op Plan:   Post-operative Plan: Extubation in OR  Informed Consent: I have reviewed the patients History and Physical, chart, labs and discussed the procedure including the risks, benefits and alternatives for the proposed anesthesia with the patient or authorized representative who has indicated his/her understanding and acceptance.   Dental advisory given  Plan Discussed with: CRNA and Surgeon  Anesthesia Plan Comments:         Anesthesia Quick Evaluation

## 2014-01-07 NOTE — Interval H&P Note (Signed)
History and Physical Interval Note:  01/07/2014 7:51 AM  Lisa Terrell  has presented today for surgery, with the diagnosis of LUL SQUAMOUS CELL CARCINOMA  The various methods of treatment have been discussed with the patient and family. After consideration of risks, benefits and other options for treatment, the patient has consented to  Procedure(s) with comments: VIDEO ASSISTED THORACOSCOPY (VATS)/ LOBECTOMY (Left) - LEFT VATS/LUL LOBECTOMY LYMPH NODE DISSECTION (Left) as a surgical intervention .  The patient's history has been reviewed, patient examined, no change in status, stable for surgery.  I have reviewed the patient's chart and labs.  Questions were answered to the patient's satisfaction.     Adriella Essex C

## 2014-01-07 NOTE — OR Nursing (Signed)
Foley insertion attempted  with a 16Fr and 14Fr.  Successful with a 12Fr.

## 2014-01-07 NOTE — Preoperative (Signed)
Beta Blockers   Reason not to administer Beta Blockers:Not Applicable 

## 2014-01-07 NOTE — Brief Op Note (Addendum)
      [address]       [place] [address]             [phone]     01/07/2014  12:25 PM  PATIENT:  Lisa Terrell  63 y.o. female  PRE-OPERATIVE DIAGNOSIS:  LUL SQUAMOUS CELL CARCINOMA  POST-OPERATIVE DIAGNOSIS:  LUL SQUAMOUS CELL CARCINOMA  PROCEDURE:  Procedure(s): VIDEO ASSISTED THORACOSCOPY (VATS)/ THORACOSCOPIC LEFT UPPER LOBECTOMY MEDIASTINAL LYMPH NODE DISSECTION ON-Q CATHETER PLACEMENT  SURGEON:  Surgeon(s): Melrose Nakayama, MD  PHYSICIAN ASSISTANT: WAYNE GOLD PA-C  ANESTHESIA:   general  SPECIMEN:  Source of Specimen:  LEFT UPPER LOBECTOMY LN SAMPLES DISPOSITION OF SPECIMEN:  Pathology  DRAINS: 2  Chest Tube(s) in the LEFT HEMITHORAX   PATIENT CONDITION:  ICU - intubated and hemodynamically stable.  PRE-OPERATIVE WEIGHT: 79kg  FROZEN: NEGATIVE BRONCHIAL MARGINS  Technically difficult secondary to unusual pulmonary artery anatomy Margins clear

## 2014-01-08 ENCOUNTER — Inpatient Hospital Stay (HOSPITAL_COMMUNITY): Payer: 59

## 2014-01-08 LAB — POCT I-STAT 3, ART BLOOD GAS (G3+)
ACID-BASE EXCESS: 1 mmol/L (ref 0.0–2.0)
Bicarbonate: 26.7 mEq/L — ABNORMAL HIGH (ref 20.0–24.0)
O2 SAT: 93 %
PO2 ART: 68 mmHg — AB (ref 80.0–100.0)
Patient temperature: 98.1
TCO2: 28 mmol/L (ref 0–100)
pCO2 arterial: 44.2 mmHg (ref 35.0–45.0)
pH, Arterial: 7.388 (ref 7.350–7.450)

## 2014-01-08 LAB — CBC
HCT: 30.3 % — ABNORMAL LOW (ref 36.0–46.0)
HEMOGLOBIN: 10.3 g/dL — AB (ref 12.0–15.0)
MCH: 30.6 pg (ref 26.0–34.0)
MCHC: 34 g/dL (ref 30.0–36.0)
MCV: 89.9 fL (ref 78.0–100.0)
Platelets: 170 10*3/uL (ref 150–400)
RBC: 3.37 MIL/uL — ABNORMAL LOW (ref 3.87–5.11)
RDW: 13.9 % (ref 11.5–15.5)
WBC: 7 10*3/uL (ref 4.0–10.5)

## 2014-01-08 LAB — BASIC METABOLIC PANEL
BUN: 14 mg/dL (ref 6–23)
CO2: 27 mEq/L (ref 19–32)
CREATININE: 0.9 mg/dL (ref 0.50–1.10)
Calcium: 8 mg/dL — ABNORMAL LOW (ref 8.4–10.5)
Chloride: 107 mEq/L (ref 96–112)
GFR calc Af Amer: 78 mL/min — ABNORMAL LOW (ref >90)
GFR calc non Af Amer: 67 mL/min — ABNORMAL LOW (ref >90)
GLUCOSE: 117 mg/dL — AB (ref 70–99)
Potassium: 3.6 mEq/L — ABNORMAL LOW (ref 3.7–5.3)
Sodium: 143 mEq/L (ref 137–147)

## 2014-01-08 MED ORDER — HYDROCHLOROTHIAZIDE 25 MG PO TABS
25.0000 mg | ORAL_TABLET | Freq: Every day | ORAL | Status: DC
  Administered 2014-01-08 – 2014-01-12 (×5): 25 mg via ORAL
  Filled 2014-01-08 (×5): qty 1

## 2014-01-08 MED ORDER — LISINOPRIL-HYDROCHLOROTHIAZIDE 20-25 MG PO TABS: 1.0000 | ORAL_TABLET | Freq: Every day | ORAL | Status: DC

## 2014-01-08 MED ORDER — LISINOPRIL 20 MG PO TABS
20.0000 mg | ORAL_TABLET | Freq: Every day | ORAL | Status: DC
  Administered 2014-01-08 – 2014-01-12 (×5): 20 mg via ORAL
  Filled 2014-01-08 (×5): qty 1

## 2014-01-08 MED ORDER — ALBUTEROL SULFATE (2.5 MG/3ML) 0.083% IN NEBU: 2.5000 mg | INHALATION_SOLUTION | Freq: Four times a day (QID) | RESPIRATORY_TRACT | Status: DC | PRN

## 2014-01-08 MED ORDER — ACETAMINOPHEN 500 MG PO TABS
1000.0000 mg | ORAL_TABLET | Freq: Three times a day (TID) | ORAL | Status: DC
  Administered 2014-01-08 (×4): 1000 mg via ORAL
  Filled 2014-01-08 (×5): qty 2

## 2014-01-08 NOTE — Anesthesia Postprocedure Evaluation (Signed)
  Anesthesia Post-op Note  Patient: Lisa Terrell  Procedure(s) Performed: Procedure(s) with comments: VIDEO ASSISTED THORACOSCOPY (VATS)/ UPPER LOBECTOMY (Left) - LEFT VATS/LUL LOBECTOMY LYMPH NODE DISSECTION (Left)  Patient Location: PACU  Anesthesia Type:General  Level of Consciousness: awake, alert  and oriented  Airway and Oxygen Therapy: Patient Spontanous Breathing  Post-op Pain: mild  Post-op Assessment: Post-op Vital signs reviewed, Patient's Cardiovascular Status Stable, Respiratory Function Stable, Patent Airway, No signs of Nausea or vomiting and Pain level controlled  Post-op Vital Signs: Reviewed and stable  Complications: No apparent anesthesia complications

## 2014-01-08 NOTE — Op Note (Signed)
Lisa Terrell, Lisa Terrell               ACCOUNT NO.:  [id number]  MEDICAL RECORD NO.:  [id number]  LOCATION:  2S03C                        FACILITY:  [facility]  PHYSICIAN:  Revonda Standard. Roxan Hockey, M.D.DATE OF BIRTH:  [birth year]  DATE OF PROCEDURE:  01/07/2014 DATE OF DISCHARGE:                              OPERATIVE REPORT   PREOPERATIVE DIAGNOSIS:  Squamous cell carcinoma, left upper lobe.  POSTOPERATIVE DIAGNOSIS:  Squamous cell carcinoma, left upper lobe.  PROCEDURE:  Left video-assisted thoracoscopy, thoracoscopic left upper lobectomy, mediastinal lymph node dissection, On-Q local anesthetic catheter placement.  SURGEON:  Revonda Standard. Roxan Hockey, M.D.  ASSISTANT:  John Giovanni, P.A.-C.  ANESTHESIA:  General.  FINDINGS:  Unusual pulmonary arterial anatomy with branch arising off the anterior aspect of the pulmonary artery and running behind the left upper lobe bronchus.  Lymph nodes appear benign grossly.  Bronchial margin free of tumor.  CLINICAL NOTE:  Lisa Terrell is a 63 year old woman with a history of tobacco abuse who originally presented with a submandibular mass.  This ultimately turned out to be lymphoid hyperplasia.  During her evaluation for that, she was found to have a left upper lobe mass.  This was biopsy- proven squamous cell carcinoma.  Her endobronchial ultrasound had revealed no mediastinal adenopathy nor was there any sign of mediastinal adenopathy on PET/CT.  Given the neck findings were benign, the patient was advised to undergo left upper lobectomy for potential cure.  The indications, risks, benefits, and alternatives were discussed in detail with the patient.  She understood and accepted the risks and agreed to proceed.  OPERATIVE NOTE:  Lisa Terrell was brought to the preoperative holding area on January 07, 2014.  There Anesthesia established intravenous access and placed an arterial blood pressure monitoring line.  She was taken to the operating room,  anesthetized, and intubated with a double-lumen endotracheal tube.  Sequential compression devices were placed on the calves for DVT prophylaxis.  Foley catheter was placed.  She was placed in a right lateral decubitus position and the left chest was prepped and draped in usual sterile fashion.  Single lung ventilation of the right lung was initiated and was tolerated well throughout the procedure.  A small port type incision was made in the seventh intercostal space in the midaxillary line.  The chest was entered bluntly using a 5 mm port and the 5 mm thoracoscope was placed in the chest.  There was good isolation of the left lung, although it did take time for the left lung to deflate.  A small working incision, 5 cm in length was made anterolaterally in the fourth intercostal space.  The serratus fibers were separated.  The intercostal muscle was divided.  No rib spreading was performed during the procedure.  The lower lobe was grasped and the inferior ligament was divided with electrocautery up to the level of the inferior pulmonary vein.  The dissection then was carried medially, this was technically difficult due to the patient's body habitus, and size of the heart.  The major fissure was relatively complete with only a thin segment anteriorly between the lingula and left lower lobe that required stapling.  There was difficulty with visualization with the  5 mm scope.  Due to technical issues, this was changed out for a 10-mm scope, which provided good visualization.  After completing the fissure, a large lingular segmental pulmonary arterial branch was identified.  While dissecting out a level 11 noted adjacent to this branch, there was bleeding from the pulmonary artery.  Surgicel and pressure were applied for a total of 10 minutes, and there was good hemostasis at that site thereafter.  The lingular branch then was encircled and divided with endoscopic GIA stapler. Next,  attention was turned to the superior pulmonary vein.  This was dissected out, encircled, and divided with an endoscopic vascular stapler.  To divide this branch, a second port type incision was made anterior to the first one to allow a good angle for the stapler to be placed.  The dissection of the pleural reflection then was carried superiorly in the hilum and the remainder the major fissure was dissected out.  Pulmonary arterial branches could be seen arising posteriorly in the major fissure as well as anteriorly.  There was a large arterial branch, which turned out to be the anterior branch that initially appeared to be the main pulmonary artery.  As the dissection progressed, level 10 and 11 nodes were taken and sent as separate specimens.  A level 4 L node also was taken and sent as a separate specimen.  The apical segmental branch was encircled and divided with a stapler and then the posterior branch was divided as well.  At this point, it was clear that the largest branch, which appeared at one time to be the main pulmonary artery, was actually a large anterior branch that ran just posterior to the bronchus.  This dissection took a considerable period of time and great care was taken during this dissection, but ultimately a stapler was able to be passed around the branch and fired dividing it.  The main pulmonary artery remained intact.  A third port incision was made posteriorly below the tip of the scapula to allow a good angle for a stapler to be placed across the left upper lobe bronchus.  The stapler was placed across the bronchus and closed, but not fired.  A test inflation showed good aeration of the lower lobe.  The stapler then was fired dividing the bronchus.  The left upper lobe was placed into an endoscopic retrieval bag and removed through the incision.  It was sent for frozen section of the bronchial margin, as the tumor was already known to be squamous cell. The  margin was free of tumor.  The chest was copiously irrigated with warm saline.  Inspection was made for air leaks.  Additional inspection revealed no other obvious lymph nodes.  There was good hemostasis.  An On-Q local anesthetic catheter was placed through a separate posterior stab incision and directed in a subpleural location.  A 36-French right- angle chest tube was placed posteriorly and a 28-French chest tube was placed anteriorly.  The remaining port incision was closed with a #1 Vicryl fascial suture and a 3-0 Vicryl subcuticular suture.  The utility incision was closed with a running #1 Vicryl to reapproximate the serratus followed by closure of the subcutaneous tissue and skin in standard fashion.  The chest tubes were placed to suction.  The patient was placed back in a supine position.  She was extubated in the operating room and taken to the postanesthetic care unit in good condition.     Revonda Standard Roxan Hockey, M.D.  SCH/MEDQ  D:  01/07/2014  T:  01/08/2014  Job:  [id number]

## 2014-01-08 NOTE — Progress Notes (Signed)
INITIAL NUTRITION ASSESSMENT  DOCUMENTATION CODES Per approved criteria  -Not Applicable   INTERVENTION: No nutrition intervention at this time --- patient declined RD to follow for nutrition care plan  NUTRITION DIAGNOSIS: Inadequate oral intake related to decreased appetite as evidenced by patient report  Goal: Pt to meet >/= 90% of their estimated nutrition needs   Monitor:  PO & supplemental intake, weight, labs, I/O's  Reason for Assessment: Malnutrition Screening Tool Report  63 y.o. female  Admitting Dx: Squamous cell carcinoma, left upper lobe  ASSESSMENT: 63 year old smoker who about 6 months ago noticed a "knot" in her neck. She wasn't having any pain, fever, chills, difficulty swallowing or shortness of breath. She didn't pay much attention to it for a while, however finally she became concerned and went to Dr. Legrand Rams. She eventually was referred to Dr. Romona Curls and then finally to Dr. Benjamine Mola. An MRI was done of the neck which showed a 2.8 cm left suprahilar lung mass with multiple level I neck nodes measuring 1.8 and 1.4 cm. There was a 1.5 cm left parotid mass. She was also noted to have a 2.8 cm suprahilar lung mass.   Patient s/p procedures 3/12: VIDEO ASSISTED THORACOSCOPY (VATS) THORACOSCOPIC LEFT UPPER LOBECTOMY  MEDIASTINAL LYMPH NODE DISSECTION  ON-Q CATHETER PLACEMENT  Patient reports her appetite is "ok".  Minimal intake this AM at breakfast.  States she was eating well PTA.  No recent unintentional weight loss.  RD offered oral nutrition supplements, however, pt declined at this time.  Nutrition focused physical exam completed.  No muscle or subcutaneous fat depletion noticed.  Height: Ht Readings from Last 1 Encounters:  01/07/14 5\' 6"  (1.676 m)    Weight: Wt Readings from Last 1 Encounters:  01/08/14 177 lb 8 oz (80.513 kg)    Ideal Body Weight: 130 lb  % Ideal Body Weight: 136%  Wt Readings from Last 10 Encounters:  01/08/14 177 lb 8 oz  (80.513 kg)  01/08/14 177 lb 8 oz (80.513 kg)  01/05/14 175 lb 4.3 oz (79.5 kg)  12/29/13 172 lb (78.019 kg)  12/01/13 172 lb (78.019 kg)  12/01/13 170 lb (77.111 kg)  12/01/13 170 lb (77.111 kg)  11/18/13 169 lb 6.4 oz (76.839 kg)  11/12/13 172 lb (78.019 kg)    Usual Body Weight: 170 lb  % Usual Body Weight: 102%  BMI:  Body mass index is 28.66 kg/(m^2).  Estimated Nutritional Needs: Kcal: 1800-2000 Protein: 90-100 gm Fluid: 1.8-2.0 L  Skin: surgical incision   Diet Order: General  EDUCATION NEEDS: -No education needs identified at this time   Intake/Output Summary (Last 24 hours) at 01/08/14 1505 Last data filed at 01/08/14 1400  Gross per 24 hour  Intake 3749.5 ml  Output   2275 ml  Net 1474.5 ml    Labs:   Recent Labs Lab 01/05/14 1350 01/08/14 0345  NA 140 143  K 4.1 3.6*  CL 106 107  CO2 21 27  BUN 17 14  CREATININE 0.88 0.90  CALCIUM 9.3 8.0*  GLUCOSE 82 117*    Scheduled Meds: . acetaminophen  1,000 mg Oral TID PC & HS  . bisacodyl  10 mg Oral Daily  . fentaNYL   Intravenous 6 times per day  . lisinopril  20 mg Oral Daily   And  . hydrochlorothiazide  25 mg Oral Daily    Continuous Infusions: . dextrose 5 % and 0.9% NaCl 75 mL/hr (01/08/14 1215)    Past Medical History  Diagnosis  Date  . Hypertension   . Mass of submandibular region 10/02/13    TWO PALPABLE LEFT MASSES NOTED  . Mass of lung PER PET 10/29/13    LEFT UPPER LOBE  . Lung nodules 11/04/13 PER PET    MEDIAL LEFT UPPER LOBE  . Varicose veins   . Eczema     Past Surgical History  Procedure Laterality Date  . Video bronchoscopy with endobronchial navigation N/A 11/19/2013    Procedure: VIDEO BRONCHOSCOPY WITH ENDOBRONCHIAL NAVIGATION;  Surgeon: Melrose Nakayama, MD;  Location: [facility];  Service: Thoracic;  Laterality: N/A;  . Endobronchial ultrasound N/A 11/19/2013    Procedure: ENDOBRONCHIAL ULTRASOUND;  Surgeon: Melrose Nakayama, MD;  Location: [facility];  Service:  Thoracic;  Laterality: N/A;  . Hammer toe surgery  2004    right  . Tubal ligation    . Mass excision N/A 12/01/2013    Procedure: EXCISION NECK MASS--SUBMANDIBULAR;  Surgeon: Ascencion Dike, MD;  Location: [facility];  Service: ENT;  Laterality: N/A;    Arthur Holms, RD, LDN Pager #: [phone] After-Hours Pager #: [phone]

## 2014-01-08 NOTE — Progress Notes (Addendum)
TCTS DAILY ICU PROGRESS NOTE                   [address]            [place] [address]          [phone]   1 Day Post-Op Procedure(s) (LRB): VIDEO ASSISTED THORACOSCOPY (VATS)/ UPPER LOBECTOMY (Left) LYMPH NODE DISSECTION (Left)  Total Length of Stay:  LOS: 1 day   Subjective: Looks and feels pretty well, PCA controlling the pain well  Objective: Vital signs in last 24 hours: Temp:  [97.6 F (36.4 C)-98.3 F (36.8 C)] 98 F (36.7 C) (03/13 0720) Pulse Rate:  [46-79] 51 (03/13 0700) Cardiac Rhythm:  [-] Normal sinus rhythm (03/13 0700) Resp:  [14-27] 16 (03/13 0700) BP: (105-137)/(36-67) 137/67 mmHg (03/13 0600) SpO2:  [95 %-100 %] 97 % (03/13 0700) Arterial Line BP: (115-169)/(49-70) 132/62 mmHg (03/13 0700) Weight:  [174 lb 2.6 oz (79 kg)-177 lb 8 oz (80.513 kg)] 177 lb 8 oz (80.513 kg) (03/13 0700)  Filed Weights   01/07/14 1545 01/08/14 0700  Weight: 174 lb 2.6 oz (79 kg) 177 lb 8 oz (80.513 kg)    Weight change:    Hemodynamic parameters for last 24 hours:    Intake/Output from previous day: 03/12 0701 - 03/13 0700 In: 3923.5 [P.O.:370; I.V.:3353.5; IV Piggyback:200] Out: 2445 [Urine:1355; Blood:200; Chest Tube:890]  Intake/Output this shift:    Current Meds: Scheduled Meds: . acetaminophen  1,000 mg Oral 4 times per day   Or  . acetaminophen (TYLENOL) oral liquid 160 mg/5 mL  1,000 mg Oral 4 times per day  . albuterol  2.5 mg Nebulization Q4H while awake  . bisacodyl  10 mg Oral Daily  . fentaNYL   Intravenous 6 times per day   Continuous Infusions: . dextrose 5 % and 0.9% NaCl 125 mL/hr at 01/07/14 2325   PRN Meds:.diphenhydrAMINE, diphenhydrAMINE, naloxone, ondansetron (ZOFRAN) IV, ondansetron (ZOFRAN) IV, oxyCODONE, oxyCODONE-acetaminophen, oxyCODONE-acetaminophen, pneumococcal 23 valent vaccine, potassium chloride, senna-docusate, sodium chloride, traMADol  General appearance: alert, cooperative and no distress Heart:  regular rate and rhythm Lungs: slight wheeze on left Abdomen: benign Extremities: no edema Wound: dressings CDI  Lab Results: CBC: Recent Labs  01/05/14 1350 01/08/14 0345  WBC 5.1 7.0  HGB 12.3 10.3*  HCT 35.9* 30.3*  PLT 175 170   BMET:  Recent Labs  01/05/14 1350 01/08/14 0345  NA 140 143  K 4.1 3.6*  CL 106 107  CO2 21 27  GLUCOSE 82 117*  BUN 17 14  CREATININE 0.88 0.90  CALCIUM 9.3 8.0*    PT/INR:  Recent Labs  01/05/14 1350  LABPROT 13.8  INR 1.08   ABG    Component Value Date/Time   PHART 7.388 01/08/2014 0351   PCO2ART 44.2 01/08/2014 0351   PO2ART 68.0* 01/08/2014 0351   HCO3 26.7* 01/08/2014 0351   TCO2 28 01/08/2014 0351   O2SAT 93.0 01/08/2014 0351     Radiology: Dg Chest Portable 1 View  01/07/2014   CLINICAL DATA:  Post left upper lobectomy for resection of squamous cell carcinoma of the left upper lobe with lymph node dissection  EXAM: PORTABLE CHEST - 1 VIEW  COMPARISON:  Chest x-ray of 01/05/2014  FINDINGS: Two left chest tubes are present, and the mass opacity in the left upper lobe noted previously is no longer seen. No pneumothorax is noted. Linear atelectasis is present at the right lung base. A right IJ central venous  line tip overlies the mid-lower SVC and cardiomegaly is stable.  IMPRESSION: 1. Resection of left upper lobe lesion with 2 left chest tubes remaining. No pneumothorax. 2. Linear atelectasis is noted at the right lung base. 3. Right IJ central venous line tip overlies mid lower SVC.   Electronically Signed   By: Ivar Drape M.D.   On: 01/07/2014 14:51     Assessment/Plan: S/P Procedure(s) (LRB): VIDEO ASSISTED THORACOSCOPY (VATS)/ UPPER LOBECTOMY (Left) LYMPH NODE DISSECTION (Left)  1 doing well 2 pos air leak- keep CT's to suction 3 d/c aline 4 routine pulm toilet/rehab 5 creat ok, will restart home lisinoprilHCTZ in am if no change 6 mild expected Acute blood loss anemia- monitor 7 tolerating some diet, will reduce IVF  to 75cc/hr, K+ replace per protocol  Lisa Terrell,Lisa Terrell 01/08/2014 7:59 AM  Patient seen and examined, agree with above Some atelectasis- add flutter valve

## 2014-01-08 NOTE — Progress Notes (Signed)
UR Completed.  Vergie Living [phone] 01/08/2014

## 2014-01-08 NOTE — Progress Notes (Signed)
Patient examined and record reviewed.Hemodynamics stable,labs satisfactory.Patient had stable day.Continue current care. VAN TRIGT III,Shaquina Gillham 01/08/2014

## 2014-01-09 ENCOUNTER — Inpatient Hospital Stay (HOSPITAL_COMMUNITY): Payer: 59

## 2014-01-09 LAB — CBC
HCT: 33.2 % — ABNORMAL LOW (ref 36.0–46.0)
Hemoglobin: 11.2 g/dL — ABNORMAL LOW (ref 12.0–15.0)
MCH: 30.9 pg (ref 26.0–34.0)
MCHC: 33.7 g/dL (ref 30.0–36.0)
MCV: 91.5 fL (ref 78.0–100.0)
PLATELETS: 172 10*3/uL (ref 150–400)
RBC: 3.63 MIL/uL — ABNORMAL LOW (ref 3.87–5.11)
RDW: 14 % (ref 11.5–15.5)
WBC: 6.6 10*3/uL (ref 4.0–10.5)

## 2014-01-09 LAB — COMPREHENSIVE METABOLIC PANEL
ALBUMIN: 2.7 g/dL — AB (ref 3.5–5.2)
ALT: 20 U/L (ref 0–35)
AST: 32 U/L (ref 0–37)
Alkaline Phosphatase: 52 U/L (ref 39–117)
BILIRUBIN TOTAL: 0.4 mg/dL (ref 0.3–1.2)
BUN: 13 mg/dL (ref 6–23)
CO2: 26 mEq/L (ref 19–32)
Calcium: 8.3 mg/dL — ABNORMAL LOW (ref 8.4–10.5)
Chloride: 106 mEq/L (ref 96–112)
Creatinine, Ser: 0.9 mg/dL (ref 0.50–1.10)
GFR calc Af Amer: 78 mL/min — ABNORMAL LOW (ref >90)
GFR calc non Af Amer: 67 mL/min — ABNORMAL LOW (ref >90)
Glucose, Bld: 105 mg/dL — ABNORMAL HIGH (ref 70–99)
Potassium: 4.2 mEq/L (ref 3.7–5.3)
Sodium: 141 mEq/L (ref 137–147)
Total Protein: 5.8 g/dL — ABNORMAL LOW (ref 6.0–8.3)

## 2014-01-09 MED ORDER — CETAPHIL MOISTURIZING EX LOTN
1.0000 "application " | TOPICAL_LOTION | Freq: Every day | CUTANEOUS | Status: DC | PRN
  Administered 2014-01-09: 1 via TOPICAL
  Filled 2014-01-09: qty 118

## 2014-01-09 MED ORDER — ENOXAPARIN SODIUM 40 MG/0.4ML ~~LOC~~ SOLN
40.0000 mg | SUBCUTANEOUS | Status: DC
  Administered 2014-01-09 – 2014-01-10 (×2): 40 mg via SUBCUTANEOUS
  Filled 2014-01-09 (×4): qty 0.4

## 2014-01-09 NOTE — Progress Notes (Signed)
2 Days Post-Op Procedure(s) (LRB): VIDEO ASSISTED THORACOSCOPY (VATS)/ UPPER LOBECTOMY (Left) LYMPH NODE DISSECTION (Left) Subjective: Patient doing well 2 days after left upper lobectomy for cancer Chest x-ray is clear Minimal airleak with cough Will remove posterior chest tube Patient ambulating in the hallway on minimal oxygen requirement We'll transfer to telemetry 2 W.  Objective: Vital signs in last 24 hours: Temp:  [97.8 F (36.6 C)-98.7 F (37.1 C)] 98.7 F (37.1 C) (03/14 0737) Pulse Rate:  [53-73] 73 (03/14 1100) Cardiac Rhythm:  [-] Normal sinus rhythm (03/14 0800) Resp:  [16-22] 19 (03/14 1100) BP: (116-152)/(53-82) 127/63 mmHg (03/14 1043) SpO2:  [96 %-100 %] 100 % (03/14 1100) Arterial Line BP: (129)/(59) 129/59 mmHg (03/13 1200) Weight:  [180 lb 6.4 oz (81.829 kg)] 180 lb 6.4 oz (81.829 kg) (03/14 0400)  Hemodynamic parameters for last 24 hours:   stable afebrile sinus  Intake/Output from previous day: 03/13 0701 - 03/14 0700 In: 9774 [P.O.:660; I.V.:2347; IV Piggyback:50] Out: 2740 [Urine:2070; Chest Tube:670] Intake/Output this shift: Total I/O In: 483 [P.O.:180; I.V.:303] Out: 340 [Urine:250; Chest Tube:90]  Patient alert and comfortable Breath sounds clear  Lab Results:  Recent Labs  01/08/14 0345 01/09/14 0420  WBC 7.0 6.6  HGB 10.3* 11.2*  HCT 30.3* 33.2*  PLT 170 172   BMET:  Recent Labs  01/08/14 0345 01/09/14 0420  NA 143 141  K 3.6* 4.2  CL 107 106  CO2 27 26  GLUCOSE 117* 105*  BUN 14 13  CREATININE 0.90 0.90  CALCIUM 8.0* 8.3*    PT/INR: No results found for this basename: LABPROT, INR,  in the last 72 hours ABG    Component Value Date/Time   PHART 7.388 01/08/2014 0351   HCO3 26.7* 01/08/2014 0351   TCO2 28 01/08/2014 0351   O2SAT 93.0 01/08/2014 0351   CBG (last 3)  No results found for this basename: GLUCAP,  in the last 72 hours  Assessment/Plan: S/P Procedure(s) (LRB): VIDEO ASSISTED THORACOSCOPY (VATS)/ UPPER  LOBECTOMY (Left) LYMPH NODE DISSECTION (Left) Transferred to 2 W. telemetry unit after chest tube is removed   LOS: 2 days    VAN TRIGT III,Clarance Bollard 01/09/2014

## 2014-01-09 NOTE — Plan of Care (Signed)
Problem: Phase I Progression Outcomes Goal: Initial discharge plan identified Outcome: Progressing Will be staying with Leonor Liv, her sister.

## 2014-01-10 ENCOUNTER — Inpatient Hospital Stay (HOSPITAL_COMMUNITY): Payer: 59

## 2014-01-10 LAB — CBC
HCT: 31.3 % — ABNORMAL LOW (ref 36.0–46.0)
Hemoglobin: 10.5 g/dL — ABNORMAL LOW (ref 12.0–15.0)
MCH: 30.5 pg (ref 26.0–34.0)
MCHC: 33.5 g/dL (ref 30.0–36.0)
MCV: 91 fL (ref 78.0–100.0)
Platelets: 173 10*3/uL (ref 150–400)
RBC: 3.44 MIL/uL — ABNORMAL LOW (ref 3.87–5.11)
RDW: 14.1 % (ref 11.5–15.5)
WBC: 7.7 10*3/uL (ref 4.0–10.5)

## 2014-01-10 LAB — BASIC METABOLIC PANEL
BUN: 13 mg/dL (ref 6–23)
CO2: 27 mEq/L (ref 19–32)
Calcium: 8.5 mg/dL (ref 8.4–10.5)
Chloride: 101 mEq/L (ref 96–112)
Creatinine, Ser: 0.95 mg/dL (ref 0.50–1.10)
GFR calc Af Amer: 73 mL/min — ABNORMAL LOW (ref >90)
GFR calc non Af Amer: 63 mL/min — ABNORMAL LOW (ref >90)
Glucose, Bld: 108 mg/dL — ABNORMAL HIGH (ref 70–99)
Potassium: 3.8 mEq/L (ref 3.7–5.3)
Sodium: 139 mEq/L (ref 137–147)

## 2014-01-10 MED ORDER — SORBITOL 70 % SOLN
15.0000 mL | Freq: Every day | Status: DC | PRN
  Administered 2014-01-10: 15 mL via ORAL
  Filled 2014-01-10: qty 30

## 2014-01-10 MED ORDER — SODIUM CHLORIDE 0.9 % IV SOLN: 20.0000 mL/h | INTRAVENOUS | Status: DC

## 2014-01-10 NOTE — Progress Notes (Addendum)
[address]       [place] [address]             [phone]      3 Days Post-Op  Procedure(s) (LRB): VIDEO ASSISTED THORACOSCOPY (VATS)/ UPPER LOBECTOMY (Left) LYMPH NODE DISSECTION (Left) Subjective: Feels a bit constipated  Objective  Telemetry sinus rhythm  Temp:  [98.6 F (37 C)-99.8 F (37.7 C)] 98.7 F (37.1 C) (03/15 0400) Pulse Rate:  [59-73] 64 (03/15 0400) Resp:  [15-22] 21 (03/15 0741) BP: (127-157)/(63-76) 151/68 mmHg (03/15 0400) SpO2:  [95 %-100 %] 98 % (03/15 0741)   Intake/Output Summary (Last 24 hours) at 01/10/14 0803 Last data filed at 01/10/14 0700  Gross per 24 hour  Intake    843 ml  Output    410 ml  Net    433 ml       General appearance: alert, cooperative and no distress Heart: regular rate and rhythm Lungs: min dim in bases Abdomen: soft, nontender Extremities: no edema Wound: incis healing well  Lab Results:  Recent Labs  01/09/14 0420 01/10/14 0405  NA 141 139  K 4.2 3.8  CL 106 101  CO2 26 27  GLUCOSE 105* 108*  BUN 13 13  CREATININE 0.90 0.95  CALCIUM 8.3* 8.5    Recent Labs  01/09/14 0420  AST 32  ALT 20  ALKPHOS 52  BILITOT 0.4  PROT 5.8*  ALBUMIN 2.7*   No results found for this basename: LIPASE, AMYLASE,  in the last 72 hours  Recent Labs  01/09/14 0420 01/10/14 0405  WBC 6.6 7.7  HGB 11.2* 10.5*  HCT 33.2* 31.3*  MCV 91.5 91.0  PLT 172 173   No results found for this basename: CKTOTAL, CKMB, TROPONINI,  in the last 72 hours No components found with this basename: POCBNP,  No results found for this basename: DDIMER,  in the last 72 hours No results found for this basename: HGBA1C,  in the last 72 hours No results found for this basename: CHOL, HDL, LDLCALC, TRIG, CHOLHDL,  in the last 72 hours No results found for this basename: TSH, T4TOTAL, FREET3, T3FREE, THYROIDAB,  in the last 72 hours No results found for this basename: VITAMINB12, FOLATE, FERRITIN, TIBC, IRON,  RETICCTPCT,  in the last 72 hours  Medications: Scheduled . bisacodyl  10 mg Oral Daily  . enoxaparin (LOVENOX) injection  40 mg Subcutaneous Q24H  . fentaNYL   Intravenous 6 times per day  . lisinopril  20 mg Oral Daily   And  . hydrochlorothiazide  25 mg Oral Daily     Radiology/Studies:  Dg Chest 2 View  01/10/2014   CLINICAL DATA:  Left chest surgery  EXAM: CHEST  2 VIEW  COMPARISON:  01/09/2014  FINDINGS: One of 2 chest tubes on the left is been removed. No pneumothorax. Minimal left pleural effusion.  Cardiac enlargement without heart failure. Right jugular catheter tip in the SVC.  IMPRESSION: No pneumothorax after removal of 1 of 2 left chest tubes. Minimal left effusion is present.   Electronically Signed   By: Franchot Gallo M.D.   On: 01/10/2014 05:53   Dg Chest Port 1 View  01/09/2014   CLINICAL DATA:  Status post left lobectomy  EXAM: PORTABLE CHEST - 1 VIEW  COMPARISON:  01/08/2014  FINDINGS: Cardiac shadow is within normal limits. Two left-sided chest tubes are seen. No pneumothorax is noted. A right jugular central line is seen. Persistent density  along the minor fissure is noted on the right. No new focal abnormality is seen.  IMPRESSION: No significant change from the prior exam. No pneumothorax is noted.   Electronically Signed   By: Inez Catalina M.D.   On: 01/09/2014 07:12   Chest tube: small air leak, serosang drainage      INR: Will add last result for INR, ABG once components are confirmed Will add last 4 CBG results once components are confirmed  Assessment/Plan: S/P Procedure(s) (LRB): VIDEO ASSISTED THORACOSCOPY (VATS)/ UPPER LOBECTOMY (Left) LYMPH NODE DISSECTION (Left)  1 doing well 2 leave CT in place with small air leak 3 labs stable 4 cont rehab/pulm toilet 5 will give some sorbitol for constipation 6 leave pca one more day 7 d/c ivf- tolerating diet    LOS: 3 days    GOLD,WAYNE E 3/15/20158:03 AM  i don't see a leak currently, she does  have a lot of tidal movement Will clamp tube and recheck a CXR later today

## 2014-01-11 ENCOUNTER — Inpatient Hospital Stay (HOSPITAL_COMMUNITY): Payer: 59

## 2014-01-11 NOTE — Discharge Summary (Signed)
Physician Discharge Summary       [address]       [place] [address]             [phone]    Patient ID: Lisa Terrell MRN: [id number] DOB/AGE: Aug 24, 1951 63 y.o.  Admit date: 01/07/2014 Discharge date: 01/12/2014  Admission Diagnoses: 1. Squamous cell carcinoma LUL 2. History of tobacco abuse 3. History of hypertension  Discharge Diagnoses:  1. Squamous cell carcinoma LUL- mpT2a,N0 2. History of tobacco abuse 3. History of hypertension  Procedure: Left video-assisted thoracoscopy, thoracoscopic left upper  lobectomy, mediastinal lymph node dissection, On-Q local anesthetic catheter placement by Dr. Roxan Hockey on 01/07/2014.  Pathology: Results not available yet  History of Presenting Illness: This is a 63 year old smoker (1 pack a day for 30 years) who about 6 months ago noticed a "knot" in her neck. She wasn't having any pain, fever, chills, difficulty swallowing, or shortness of breath. She didn't pay much attention to it for a while;however,  finally she became concerned and went to Dr. Legrand Rams. She eventually was referred to Dr. Romona Curls and then finally to Dr. Benjamine Mola. An MRI was done of the neck which showed a 2.8 cm left suprahilar lung mass with multiple level I neck nodes measuring 1.8 and 1.4 cm. There was a 1.5 cm left parotid mass. She was also noted to have a 2.8 cm suprahilar lung mass.  A PET CT was done which showed these lesions to be hypermetabolic.  I did bronchoscopy and biopsy of the left upper lobe mass on 11/20/2013 which was positive for squamous cell carcinoma.  She had an aspiration of a neck lymph node by Dr. Benjamine Mola, which was unremarkable. She then had the nodes resected on 12/01/2013. Pathology showed lymphoid hyperplasia.  She missed her appointment last week due to the weather.  She continues to smoke a pack a day. She denies any chest pain, pressure, tightness, or shortness of breath. She has had a nonproductive cough but no  hemoptysis. She denieswheezing. Her weight has been stable. She continues to work. Lymphoma work up was negative. Her case was discussed at [facility]. The consensus was that a left upper lobectomy was recommended. Potential risks, complications, and benefits of the surgery were discussed with the patient and she agreed to proceed with surgery. She underwent a left VATS, thoracoscopic LUL, On Q placement, and lymph node dissection on 01/07/2014.   Brief Hospital Course:  She remained afebrile and hemodynamically stable. A line and foley were removed early in the post operative course. The chest tubes had a small air leak. Daily chest x rays were obtained. One chest tube was then removed on 3/14. The remaining chest tube was placed to water seal. On Q was removed.There was a small air leak on 3/15. On 3/16, there was a lot of tidal movement but no obvious air leak. The chest tube was clamped and another chest x ray was obtained. This remained stable. Chest tube was removed on 3/**. She is ambulating on room air. She is tolerating a diet and has had a bowel movement.    Latest Vital Signs: Blood pressure 133/85, pulse 55, temperature 98 F (36.7 C), temperature source Oral, resp. rate 17, height 5\' 6"  (1.676 m), weight 180 lb 6.4 oz (81.829 kg), SpO2 98.00%.  Physical Exam: Cardiovascular: RRR  Pulmonary: Clear to auscultation bilaterally; no rales, wheezes, or rhonchi.  Abdomen: Soft, non tender, bowel sounds present.  Extremities: Trace lower extremity edema.  Wounds: Dressing is clean and dry.  Chest Tube: Clamped, no air leak  Discharge Condition:Stable  Recent laboratory studies:  Lab Results  Component Value Date   WBC 7.7 01/10/2014   HGB 10.5* 01/10/2014   HCT 31.3* 01/10/2014   MCV 91.0 01/10/2014   PLT 173 01/10/2014   Lab Results  Component Value Date   NA 139 01/10/2014   K 3.8 01/10/2014   CL 101 01/10/2014   CO2 27 01/10/2014   CREATININE 0.95 01/10/2014   GLUCOSE 108*  01/10/2014      Diagnostic Studies: Dg Chest 2 View  01/11/2014   CLINICAL DATA:  Vats  EXAM: CHEST  2 VIEW  COMPARISON:  Chest x-ray from yesterday  FINDINGS: Right IJ catheter and left-sided chest tube are in similar position.  Normal heart size. Postoperative changes to the left hilum. There is new subsegmental atelectasis in the right mid lung. There is small left effusion. Trace left apical pneumothorax.  IMPRESSION: 1. Trace left hydro pneumothorax. Left-sided chest tube is in unchanged position. 2. New, mild right-sided atelectasis.   Electronically Signed   By: Jorje Guild M.D.   On: 01/11/2014 07:34     Future Appointments Provider Department Dept Phone   01/26/2014 1:30 PM Melrose Nakayama, MD [facility] [phone]      Discharge Medications:   Medication List         ALOE VERA PO  Take 1 capsule by mouth daily as needed (for constipation).     cetaphil lotion  Apply 1 application topically daily as needed for dry skin.     lisinopril-hydrochlorothiazide 20-25 MG per tablet  Commonly known as:  PRINZIDE,ZESTORETIC  Take 1 tablet by mouth daily.     oxyCODONE-acetaminophen 5-325 MG per tablet  Commonly known as:  PERCOCET/ROXICET  Take 1-2 tablets by mouth every 4 (four) hours as needed for severe pain.        Follow Up Appointments: Follow-up Information   Follow up with Melrose Nakayama, MD On 01/26/2014. (PA/LAT CXR to be taken at ([facility] which is in the same building as Dr. Leonarda Salon office) on 01/26/2014 at 12:30 pm;Appointment with Dr. Roxan Hockey is at 1:30 pm )    Specialty:  Cardiothoracic Surgery   Contact information:   [address] [place] [address] [phone]       Signed: Lars Pinks MPA-C 01/12/2014, 8:06 AM

## 2014-01-11 NOTE — Progress Notes (Signed)
Chest tube removed per protocol. Patient tolerated well. Remains in bed for CXR. Radiology  Notified. Call bell near.Cindee Salt

## 2014-01-11 NOTE — Progress Notes (Signed)
      [address]       [place] [address]             [phone]       4 Days Post-Op Procedure(s) (LRB): VIDEO ASSISTED THORACOSCOPY (VATS)/ UPPER LOBECTOMY (Left) LYMPH NODE DISSECTION (Left)  Subjective: Patient just finished lunch. She had a bowel movement yesterday. She has no specific complaints.  Objective: Vital signs in last 24 hours: Temp:  [98.7 F (37.1 C)-99.1 F (37.3 C)] 98.8 F (37.1 C) (03/16 0547) Pulse Rate:  [58-83] 58 (03/16 0547) Cardiac Rhythm:  [-] Normal sinus rhythm (03/16 0800) Resp:  [18] 18 (03/16 0547) BP: (118-146)/(65-89) 118/65 mmHg (03/16 1021) SpO2:  [95 %-100 %] 100 % (03/16 0547)     Intake/Output from previous day: 03/15 0701 - 03/16 0700 In: 483 [P.O.:480; I.V.:3] Out: 313 [Chest Tube:313]   Physical Exam:  Cardiovascular: RRR Pulmonary: Clear to auscultation bilaterally; no rales, wheezes, or rhonchi. Abdomen: Soft, non tender, bowel sounds present. Extremities: Trace lower extremity edema. Wounds: Dressing is clean and dry.   Chest Tube: Clamped, no air leak  Lab Results: CBC: Recent Labs  01/09/14 0420 01/10/14 0405  WBC 6.6 7.7  HGB 11.2* 10.5*  HCT 33.2* 31.3*  PLT 172 173   BMET:  Recent Labs  01/09/14 0420 01/10/14 0405  NA 141 139  K 4.2 3.8  CL 106 101  CO2 26 27  GLUCOSE 105* 108*  BUN 13 13  CREATININE 0.90 0.95  CALCIUM 8.3* 8.5    PT/INR: No results found for this basename: LABPROT, INR,  in the last 72 hours ABG:  INR: Will add last result for INR, ABG once components are confirmed Will add last 4 CBG results once components are confirmed  Assessment/Plan:  1. CV - SB earlier this am. History of hypertension. On HCTZ 25 daily and Lisinopril 20 daily. 2.  Pulmonary - CXR shows questionable trace left pneumothorax and right sided atelectasis. CT was clamped earlier. There appears to be no air leak. Hope to remove CT soon.Encourage incentive spirometer 3. ABL  anemia-last H and H 10.5 and 31.3  Marialena Wollen MPA-C 01/11/2014,12:58 PM

## 2014-01-11 NOTE — Discharge Instructions (Signed)
Video-Assisted Thoracic Surgery °Care After °Refer to this sheet in the next few weeks. These instructions provide you with information on caring for yourself after your procedure. Your caregiver may also give you more specific instructions. Your procedure has been planned according to current medical practices, but problems sometimes occur. Call your caregiver if you have any problems or questions after your procedure. °HOME CARE INSTRUCTIONS  °· Only take over-the-counter or prescription medications as directed. °· Only take pain medications (narcotics) as directed. °· Do not drive until your caregiver approves. Driving while taking narcotics or soon after surgery can be dangerous, so discuss the specific timing with your caregiver. °· Avoid activities that use your chest muscles, such as lifting heavy objects, for at least 3 4 weeks.   °· Take deep breaths to expand the lungs and to protect against pneumonia. °· Do breathing exercises as directed by your caregiver. If you were given an incentive spirometer to help with breathing, use it as directed. °· You may resume a normal diet and activities when you feel you are able to or as directed. °· Do not take a bath until your caregiver says it is OK. Use the shower instead.   °· Keep the bandage (dressing) covering the area where the chest tube was inserted (incision site) dry for 48 hours. After 48 hours, remove the dressing unless there is new drainage. °· Remove dressings as directed by your caregiver. °· Change dressings if necessary or as directed. °· Keep all follow-up appointments. It is important for you to see your caregiver after surgery to discuss appropriate follow-up care and surveillance, if it is necessary. °SEEK MEDICAL CARE: °· You feel excessive or increasing pain at an incision site. °· You notice bleeding, skin irritation, drainage, swelling, or redness at an incision site. °· There is a bad smell coming from an incision or dressing. °· It feels  like your heart is fluttering or beating rapidly. °· Your pain medication does not relieve your pain. °SEEK IMMEDIATE MEDICAL CARE IF:  °· You have a fever.   °· You have chest pain.  °· You have a rash. °· You have shortness of breath. °· You have trouble breathing.   °· You feel weak, lightheaded, dizzy, or faint.   °MAKE SURE YOU:  °· Understand these instructions.   °· Will watch your condition.   °· Will get help right away if you are not doing well or get worse. °Document Released: 02/09/2013 Document Reviewed: 02/09/2013 °[employer]® Patient Information ©2014 [employer]. ° °

## 2014-01-12 ENCOUNTER — Inpatient Hospital Stay (HOSPITAL_COMMUNITY): Payer: 59

## 2014-01-12 ENCOUNTER — Encounter (HOSPITAL_COMMUNITY): Payer: Self-pay | Admitting: Thoracic Surgery (Cardiothoracic Vascular Surgery)

## 2014-01-12 MED ORDER — OXYCODONE-ACETAMINOPHEN 5-325 MG PO TABS: 1.0000 | ORAL_TABLET | ORAL | Status: DC | PRN

## 2014-01-12 NOTE — Progress Notes (Signed)
Right neck dressing remains dry/intact No bleeding or hematoma noted. [place] for d/c home. Discharge instructions along with med list and scripts provided. Awaiting family for d/c home.Cindee Salt

## 2014-01-12 NOTE — Progress Notes (Signed)
Right IJ central line discontinued per order/protocol. Patient instructed to remain in bed x42min. Call bell near. Will continue to monitor.Cindee Salt

## 2014-01-12 NOTE — Progress Notes (Addendum)
      [address]       [place] [address]             [phone]       5 Days Post-Op Procedure(s) (LRB): VIDEO ASSISTED THORACOSCOPY (VATS)/ UPPER LOBECTOMY (Left) LYMPH NODE DISSECTION (Left)  Subjective: Patient's only complaint is itching left side of back  Objective: Vital signs in last 24 hours: Temp:  [97.9 F (36.6 C)-98.2 F (36.8 C)] 98 F (36.7 C) (03/17 0507) Pulse Rate:  [55-59] 55 (03/17 0507) Cardiac Rhythm:  [-] Sinus bradycardia (03/16 2000) Resp:  [17-20] 17 (03/17 0507) BP: (115-133)/(55-85) 133/85 mmHg (03/17 0507) SpO2:  [97 %-100 %] 98 % (03/17 0507)     Intake/Output from previous day: 03/16 0701 - 03/17 0700 In: 960 [P.O.:960] Out: -    Physical Exam:  Cardiovascular: RRR Pulmonary: Clear to auscultation bilaterally; no rales, wheezes, or rhonchi. Abdomen: Soft, non tender, bowel sounds present. Extremities: Trace lower extremity edema. Wounds: Wound is clean and dry.  No rash. All dressings removed. A few small areas of superficial skin tears. No signs of infection.   Lab Results: CBC:  Recent Labs  01/10/14 0405  WBC 7.7  HGB 10.5*  HCT 31.3*  PLT 173   BMET:   Recent Labs  01/10/14 0405  NA 139  K 3.8  CL 101  CO2 27  GLUCOSE 108*  BUN 13  CREATININE 0.95  CALCIUM 8.5    PT/INR: No results found for this basename: LABPROT, INR,  in the last 72 hours ABG:  INR: Will add last result for INR, ABG once components are confirmed Will add last 4 CBG results once components are confirmed  Assessment/Plan:  1. CV - SB. History of hypertension. On HCTZ 25 daily and Lisinopril 20 daily. 2.  Pulmonary - Chest tube removed yesterday afternoon. CXR appears to show probable trace left apical pneumothorax.Encourage incentive spirometer. Final pathology results not available yet.  3. ABL anemia-last H and H 10.5 and 31.3 4. Remove central line 5.Likely discharge today  ZIMMERMAN,DONIELLE  MPA-C 01/12/2014,7:38 AM  Patient seen and examined, agree with above. She looks great Path still pending Dc home today

## 2014-01-21 ENCOUNTER — Other Ambulatory Visit: Payer: Self-pay | Admitting: *Deleted

## 2014-01-21 DIAGNOSIS — R918 Other nonspecific abnormal finding of lung field: Secondary | ICD-10-CM

## 2014-01-26 ENCOUNTER — Ambulatory Visit
Admission: RE | Admit: 2014-01-26 | Discharge: 2014-01-26 | Disposition: A | Discharge status: Home / Self Care | Payer: 59 | Source: Ambulatory Visit | Attending: Thoracic Surgery (Cardiothoracic Vascular Surgery) | Admitting: Thoracic Surgery (Cardiothoracic Vascular Surgery)

## 2014-01-26 ENCOUNTER — Ambulatory Visit (INDEPENDENT_AMBULATORY_CARE_PROVIDER_SITE_OTHER): Payer: Self-pay | Admitting: Thoracic Surgery (Cardiothoracic Vascular Surgery)

## 2014-01-26 ENCOUNTER — Encounter: Payer: Self-pay | Admitting: Thoracic Surgery (Cardiothoracic Vascular Surgery)

## 2014-01-26 VITALS — BP 153/91 | HR 88 | Resp 16 | Ht 66.0 in | Wt 180.0 lb

## 2014-01-26 DIAGNOSIS — Z09 Encounter for follow-up examination after completed treatment for conditions other than malignant neoplasm: Secondary | ICD-10-CM

## 2014-01-26 DIAGNOSIS — C3492 Malignant neoplasm of unspecified part of left bronchus or lung: Secondary | ICD-10-CM

## 2014-01-26 DIAGNOSIS — R918 Other nonspecific abnormal finding of lung field: Secondary | ICD-10-CM

## 2014-01-26 DIAGNOSIS — Z9889 Other specified postprocedural states: Secondary | ICD-10-CM

## 2014-01-26 DIAGNOSIS — C3490 Malignant neoplasm of unspecified part of unspecified bronchus or lung: Secondary | ICD-10-CM

## 2014-01-26 DIAGNOSIS — Z902 Acquired absence of lung [part of]: Secondary | ICD-10-CM

## 2014-01-26 NOTE — Progress Notes (Signed)
  HPI:  Lisa Terrell returns today for scheduled postoperative followup visit.  She underwent a thoracoscopic left upper lobectomy on March 12 for what turned out to be an mT2 N0 squamous cell carcinoma. There were 2 separate cancers in the left upper lobe. One was 4.7 cm and the other was in close proximity and was 1.1 cm. Postoperatively she did well without any significant complications.  Lisa Terrell says that she feels well. She has some discomfort but is not taking any pain medication. She's not had any problems with her breathing since discharge. Her appetite is good.  Past Medical History  Diagnosis Date  . Hypertension   . Mass of submandibular region 10/02/13    TWO PALPABLE LEFT MASSES NOTED  . Mass of lung PER PET 10/29/13    LEFT UPPER LOBE  . Lung nodules 11/04/13 PER PET    MEDIAL LEFT UPPER LOBE  . Varicose veins   . Eczema       Current Outpatient Prescriptions  Medication Sig Dispense Refill  . ALOE VERA PO Take 1 capsule by mouth daily as needed (for constipation).       . cetaphil (CETAPHIL) lotion Apply 1 application topically daily as needed for dry skin.      Marland Kitchen lisinopril-hydrochlorothiazide (PRINZIDE,ZESTORETIC) 20-25 MG per tablet Take 1 tablet by mouth daily.      Marland Kitchen oxyCODONE-acetaminophen (PERCOCET/ROXICET) 5-325 MG per tablet Take 1-2 tablets by mouth every 4 (four) hours as needed for severe pain.  30 tablet  0   No current facility-administered medications for this visit.    Physical Exam BP 153/91  Pulse 88  Resp 16  Ht 5\' 6"  (1.676 m)  Wt 180 lb (81.647 kg)  BMI 29.07 kg/m2  SpO41 20% 63 year old woman in no acute distress Alert and oriented x3 Lungs clear Cardiac regular rate and rhythm Incisions well healed  Diagnostic Tests: CHEST 2 VIEW  COMPARISON: Prior radiograph from 01/12/2014  FINDINGS:  The cardiac and mediastinal silhouettes are stable in size and  contour, and remain within normal limits. Surgical clips overlie the  left hilum.  Central venous catheter has been removed.  A small left pleural effusion persists. Previously seen left-sided  pneumothorax has resolved. The right lung is clear. No new focal  infiltrate. No pulmonary edema.  No acute osseus abnormality.  IMPRESSION:  Persistent small left pleural effusion. Previously seen left  pneumothorax has resolved.  Electronically Signed  By: Jeannine Boga M.D.  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 She is doing exceptionally well at this point in time. She has minimal discomfort. Her excess tolerance is good. She is anxious to resume full activities.  We did discuss her case and our multidisciplinary thoracic oncology conference. The consensus opinion was that she would benefit from adjuvant chemotherapy given that she had a large (greater than 4 cm) primary as well as the multiple sites. I discussed this with Lisa Terrell and offered her the option of seeing her oncologist hearing [facility] or seeing them in [place]. She would like to see the oncologist and refill so we will arrange that for her.   Plan: Referral to oncology for adjuvant chemotherapy  I will see her back in 2 months with a plain chest x-ray to check on her progress

## 2014-01-26 NOTE — Progress Notes (Signed)
SUTURES REMOVED FROM 2 PREVIOUS CHEST TUBE SITES.  THESE AS WELL AS THE LEFT VATS SITES ARE VERY WELL HEALED.

## 2014-02-11 ENCOUNTER — Other Ambulatory Visit (HOSPITAL_COMMUNITY): Payer: Self-pay | Admitting: Oncology

## 2014-02-11 DIAGNOSIS — C3490 Malignant neoplasm of unspecified part of unspecified bronchus or lung: Secondary | ICD-10-CM

## 2014-02-15 ENCOUNTER — Ambulatory Visit (HOSPITAL_COMMUNITY)
Admission: RE | Admit: 2014-02-15 | Discharge: 2014-02-15 | Disposition: A | Discharge status: Home / Self Care | Payer: 59 | Source: Ambulatory Visit | Attending: Oncology | Admitting: Oncology

## 2014-02-15 ENCOUNTER — Other Ambulatory Visit (HOSPITAL_COMMUNITY): Payer: Self-pay | Admitting: Oncology

## 2014-02-15 DIAGNOSIS — C3490 Malignant neoplasm of unspecified part of unspecified bronchus or lung: Secondary | ICD-10-CM

## 2014-02-15 MED ORDER — HEPARIN SOD (PORK) LOCK FLUSH 100 UNIT/ML IV SOLN
INTRAVENOUS | Status: AC
  Filled 2014-02-15: qty 5

## 2014-02-15 NOTE — Procedures (Signed)
Successful placement of dual lumen PICC line to right brachial vein. Length 40cm Tip at lower SVC/RA No complications Ready for use.  Ascencion Dike PA-C Interventional Radiology 02/15/2014 2:54 PM

## 2014-03-19 ENCOUNTER — Other Ambulatory Visit: Payer: Self-pay | Admitting: Thoracic Surgery (Cardiothoracic Vascular Surgery)

## 2014-03-19 DIAGNOSIS — R918 Other nonspecific abnormal finding of lung field: Secondary | ICD-10-CM

## 2014-03-23 ENCOUNTER — Ambulatory Visit
Admission: RE | Admit: 2014-03-23 | Discharge: 2014-03-23 | Disposition: A | Discharge status: Home / Self Care | Payer: 59 | Source: Ambulatory Visit | Attending: Thoracic Surgery (Cardiothoracic Vascular Surgery) | Admitting: Thoracic Surgery (Cardiothoracic Vascular Surgery)

## 2014-03-23 ENCOUNTER — Ambulatory Visit (INDEPENDENT_AMBULATORY_CARE_PROVIDER_SITE_OTHER): Payer: Self-pay | Admitting: Thoracic Surgery (Cardiothoracic Vascular Surgery)

## 2014-03-23 VITALS — BP 165/90 | HR 72 | Resp 16 | Ht 66.0 in | Wt 179.0 lb

## 2014-03-23 DIAGNOSIS — C3410 Malignant neoplasm of upper lobe, unspecified bronchus or lung: Secondary | ICD-10-CM

## 2014-03-23 DIAGNOSIS — Z9889 Other specified postprocedural states: Secondary | ICD-10-CM

## 2014-03-23 DIAGNOSIS — R918 Other nonspecific abnormal finding of lung field: Secondary | ICD-10-CM

## 2014-03-23 DIAGNOSIS — Z902 Acquired absence of lung [part of]: Secondary | ICD-10-CM

## 2014-03-23 NOTE — Progress Notes (Signed)
  HPI:  Lisa Terrell returns today for a 3 month followup visit.  She had a left upper lobectomy for an mT2 N0 non-small cell lung cancer in March. She did well postoperatively. Her largest tumor was a 4.7 cm and we felt she should have adjuvant chemotherapy. She has had 2 cycles of chemotherapy which she is receiving from Dr. Tressie Stalker. She has tolerated this well.  She's not having any shortness of breath. Her biggest complaint is with exercise she is having lower back pain. She's been taking hydrocodone for that, but it makes her sleepy. She is requesting a different pain medication.  Past Medical History  Diagnosis Date  . Hypertension   . Mass of submandibular region 10/02/13    TWO PALPABLE LEFT MASSES NOTED  . Mass of lung PER PET 10/29/13    LEFT UPPER LOBE  . Lung nodules 11/04/13 PER PET    MEDIAL LEFT UPPER LOBE  . Varicose veins   . Eczema       Current Outpatient Prescriptions  Medication Sig Dispense Refill  . ALOE VERA PO Take 1 capsule by mouth daily as needed (for constipation).       . cetaphil (CETAPHIL) lotion Apply 1 application topically daily as needed for dry skin.      Marland Kitchen HYDROcodone-acetaminophen (NORCO/VICODIN) 5-325 MG per tablet Take 1 tablet every 4 to 6 hours as needed for pain.      Marland Kitchen lisinopril-hydrochlorothiazide (PRINZIDE,ZESTORETIC) 20-25 MG per tablet Take 1 tablet by mouth daily.      Marland Kitchen oxyCODONE-acetaminophen (PERCOCET/ROXICET) 5-325 MG per tablet Take 1-2 tablets by mouth every 4 (four) hours as needed for severe pain.  30 tablet  0  . traMADol (ULTRAM) 50 MG tablet Take 50 mg by mouth every 8 (eight) hours as needed.      Marland Kitchen LORazepam (ATIVAN) 1 MG tablet       . prochlorperazine (COMPAZINE) 10 MG tablet        No current facility-administered medications for this visit.    Physical Exam BP 165/90  Pulse 72  Resp 16  Ht 5\' 6"  (1.676 m)  Wt 179 lb (81.194 kg)  BMI 28.91 kg/m2  SpO43 21% 63 year old woman in no acute distress Alert and  oriented with no neurologic deficit No cervical or supraclavicular adenopathy Lungs clear with essentially equal breath sounds bilaterally Incisions well healed  Diagnostic Tests: Chest x-ray 03/23/2014 shows postoperative changes there are no effusions or infiltrates  Impression: 63 year old woman who is now 3 months post left upper lobectomy. She is doing very well at this point in time. She has had 2 of 4 cycles of adjuvant chemotherapy. Her appetite remains good and she is anxious to increase her physical activities.  She is having some back pain and hydrocodone is making her sleepy. I gave her a prescription for Ultram 50 mg tablets one to 2 tablets 3 times daily as needed for pain, 30 tablets, no refills  Plan:  Return in 3 months with CT of the chest for 6 month followup visit

## 2014-04-26 ENCOUNTER — Other Ambulatory Visit (HOSPITAL_COMMUNITY): Payer: Self-pay | Admitting: Oncology

## 2014-04-26 DIAGNOSIS — C3411 Malignant neoplasm of upper lobe, right bronchus or lung: Secondary | ICD-10-CM

## 2014-05-11 ENCOUNTER — Ambulatory Visit (HOSPITAL_COMMUNITY)
Admission: RE | Admit: 2014-05-11 | Discharge: 2014-05-11 | Disposition: A | Discharge status: Home / Self Care | Payer: 59 | Source: Ambulatory Visit | Attending: Oncology | Admitting: Oncology

## 2014-05-11 DIAGNOSIS — C3411 Malignant neoplasm of upper lobe, right bronchus or lung: Secondary | ICD-10-CM

## 2014-05-11 DIAGNOSIS — R091 Pleurisy: Secondary | ICD-10-CM | POA: Insufficient documentation

## 2014-05-11 DIAGNOSIS — C3490 Malignant neoplasm of unspecified part of unspecified bronchus or lung: Secondary | ICD-10-CM | POA: Insufficient documentation

## 2014-05-11 LAB — GLUCOSE, CAPILLARY: Glucose-Capillary: 121 mg/dL — ABNORMAL HIGH (ref 70–99)

## 2014-05-11 MED ORDER — FLUDEOXYGLUCOSE F - 18 (FDG) INJECTION
9.6000 | Freq: Once | INTRAVENOUS | Status: AC | PRN
  Administered 2014-05-11: 9.6 via INTRAVENOUS

## 2014-05-17 ENCOUNTER — Other Ambulatory Visit: Payer: Self-pay | Admitting: *Deleted

## 2014-05-17 DIAGNOSIS — R222 Localized swelling, mass and lump, trunk: Secondary | ICD-10-CM

## 2014-06-15 ENCOUNTER — Ambulatory Visit (INDEPENDENT_AMBULATORY_CARE_PROVIDER_SITE_OTHER): Payer: 59 | Admitting: Thoracic Surgery (Cardiothoracic Vascular Surgery)

## 2014-06-15 ENCOUNTER — Encounter: Payer: Self-pay | Admitting: Thoracic Surgery (Cardiothoracic Vascular Surgery)

## 2014-06-15 ENCOUNTER — Other Ambulatory Visit: Payer: 59

## 2014-06-15 VITALS — BP 125/76 | HR 87 | Ht 66.0 in | Wt 179.0 lb

## 2014-06-15 DIAGNOSIS — C3410 Malignant neoplasm of upper lobe, unspecified bronchus or lung: Secondary | ICD-10-CM

## 2014-06-15 NOTE — Progress Notes (Signed)
HPI:  Lisa Terrell returns today for a scheduled followup visit.  She is a 63 year old woman who presented initially with submandibular nodules. A CT of the neck showed a left apical mass. The submandibular nodules turned out to be hyperplastic and not malignant. She ultimately underwent a thoracoscopic left upper lobectomy on 01/07/2014. She turned out to have 2 separate squamous cell cancers one 4.7 cm and the other 1.1 cm. She was staged as mT2, N0.  She underwent adjuvant chemotherapy by Dr. Tressie Stalker in [place]. She completed that in June.  Says that overall she feels well. She did develop some back pain and right knee pain during chemotherapy. She says that those are still bothering her to some degree. She also complains of a lump in her left upper abdomen. She is not having any significant incisional pain. She denies any shortness of breath. Her weight is stable.  Past Medical History  Diagnosis Date  . Hypertension   . Mass of submandibular region 10/02/13    TWO PALPABLE LEFT MASSES NOTED  . Mass of lung PER PET 10/29/13    LEFT UPPER LOBE  . Lung nodules 11/04/13 PER PET    MEDIAL LEFT UPPER LOBE  . Varicose veins   . Eczema    mT2,N0 squamous cell carcinoma left upper lobe, status post left upper lobectomy March 2015   Current Outpatient Prescriptions  Medication Sig Dispense Refill  . HYDROcodone-acetaminophen (NORCO/VICODIN) 5-325 MG per tablet Take 1 tablet every 4 to 6 hours as needed for pain.      Marland Kitchen lisinopril-hydrochlorothiazide (PRINZIDE,ZESTORETIC) 20-25 MG per tablet Take 1 tablet by mouth daily.      . potassium chloride SA (K-DUR,KLOR-CON) 20 MEQ tablet Take 20 mEq by mouth.      . prochlorperazine (COMPAZINE) 10 MG tablet Take 1 tablet by mouth.      . ALOE VERA PO Take 1 capsule by mouth daily as needed (for constipation).       . cetaphil (CETAPHIL) lotion Apply 1 application topically daily as needed for dry skin.      Marland Kitchen LORazepam (ATIVAN) 1 MG tablet       .  prochlorperazine (COMPAZINE) 10 MG tablet       . traMADol (ULTRAM) 50 MG tablet Take 1 tablet by mouth.       No current facility-administered medications for this visit.    Physical Exam BP 125/76  Pulse 87  Ht 5\' 6"  (1.676 m)  Wt 179 lb (81.194 kg)  BMI 28.91 kg/m2  SpO59 10% 63 year old woman in no acute distress Well-nourished Alert and oriented x3 with no focal neurologic deficits Lungs diminished breath sounds left base, otherwise clear Cardiac regular rate and rhythm Decreased size of submandibular nodules No cervical or should or adenopathy  Diagnostic Tests: NUCLEAR MEDICINE PET SKULL BASE TO THIGH  TECHNIQUE:  Nine point sig mCi F-18 FDG was injected intravenously. Full-ring  PET imaging was performed from the skull base to thigh after the  radiotracer. CT data was obtained and used for attenuation  correction and anatomic localization.  FASTING BLOOD GLUCOSE: Value: 121 mg/dl  COMPARISON: PET-CT 11/04/2013  FINDINGS:  NECK  The two hypermetabolic left submental lymph nodes seen on comparison  exam have decreased in size compared to prior. The more medial node  is barely measurable at 4 mm compared to 16 mm on prior. The more  lateral node measures 10 mm compared to 12 mm on prior. The more  lateral node has metabolic activity (  SUV max 2.6) decreased from SUV  max 12.9 on comparison exam.  CHEST  The previously seen left upper lobe mass lesion has been resected.  There is postsurgical change the left upper lobe consistent upper  lobectomy. No hypermetabolic pulmonary nodules. There is mild  pleural thickening in the left lateral hemi thorax (image number 81,  series 4) which ahs mild metabolic activity which is likely  postprocedural.  There are small bilateral minimally hypermetabolic lymph nodes in  the axilla which have benign morphology and very low metabolic  activity.  ABDOMEN/PELVIS  No abnormal hypermetabolic activity within the liver, pancreas,   adrenal glands, or spleen. No hypermetabolic lymph nodes in the  abdomen or pelvis. Resolution of hypermetabolic inguinal lymph  nodes.  SKELETON  No focal hypermetabolic activity to suggest skeletal metastasis.  IMPRESSION:  1. Resection of hypermetabolic left upper lobe pulmonary mass. No  evidence of residual malignancy in the lungs.  2. No hypermetabolic mediastinal lymph nodes.  3. Very small very mildly hypermetabolic axial lymph nodes are  likely reactive.  4. Interval reduction in size and metabolic activity of submental  lymph nodes on the left. Etiology is uncertain for the initial  activity and enlargement of these lymph nodes.  5. Resolution of hypermetabolic inguinal nodes.  Electronically Signed  By: Suzy Bouchard M.D.  On: 05/11/2014 12:04    Impression: 63 year old woman who underwent a thoracoscopic left upper lobectomy for multiple squamous cell carcinomas in the left upper lobe. She's been treated with adjuvant chemotherapy. Her posttreatment that showed no evidence of disease.  I did reassure her that is very common to feel muscular laxity in the upper abdominal area after thoracic surgery.  The right knee and back pain seems to be improving to some degree. Advised her to give that another month. She sees Dr. Tressie Stalker in October. If that is still bothering her at that time she can discuss the issue with him.  Plan:  Return in 6 months for one year followup visit. I will not schedule a scan at this time as she will likely be getting scans by Dr. Tressie Stalker around that time.

## 2014-06-22 ENCOUNTER — Other Ambulatory Visit: Payer: 59

## 2014-06-22 ENCOUNTER — Ambulatory Visit: Payer: 59 | Admitting: Thoracic Surgery (Cardiothoracic Vascular Surgery)

## 2014-08-12 ENCOUNTER — Other Ambulatory Visit (HOSPITAL_COMMUNITY): Payer: Self-pay | Admitting: Oncology

## 2014-08-12 DIAGNOSIS — C3492 Malignant neoplasm of unspecified part of left bronchus or lung: Secondary | ICD-10-CM

## 2014-08-12 DIAGNOSIS — C3412 Malignant neoplasm of upper lobe, left bronchus or lung: Secondary | ICD-10-CM

## 2014-08-12 DIAGNOSIS — R160 Hepatomegaly, not elsewhere classified: Secondary | ICD-10-CM

## 2014-08-13 ENCOUNTER — Other Ambulatory Visit: Payer: Self-pay | Admitting: Radiology

## 2014-08-14 ENCOUNTER — Other Ambulatory Visit: Payer: Self-pay | Admitting: Radiology

## 2014-08-16 ENCOUNTER — Ambulatory Visit (HOSPITAL_COMMUNITY)
Admission: RE | Admit: 2014-08-16 | Discharge: 2014-08-16 | Disposition: A | Discharge status: Home / Self Care | Payer: 59 | Source: Ambulatory Visit | Attending: Oncology | Admitting: Oncology

## 2014-08-16 ENCOUNTER — Encounter (HOSPITAL_COMMUNITY): Payer: Self-pay

## 2014-08-16 DIAGNOSIS — C787 Secondary malignant neoplasm of liver and intrahepatic bile duct: Secondary | ICD-10-CM | POA: Diagnosis not present

## 2014-08-16 DIAGNOSIS — I10 Essential (primary) hypertension: Secondary | ICD-10-CM | POA: Insufficient documentation

## 2014-08-16 DIAGNOSIS — C3412 Malignant neoplasm of upper lobe, left bronchus or lung: Secondary | ICD-10-CM | POA: Insufficient documentation

## 2014-08-16 DIAGNOSIS — C3492 Malignant neoplasm of unspecified part of left bronchus or lung: Secondary | ICD-10-CM

## 2014-08-16 DIAGNOSIS — Z87891 Personal history of nicotine dependence: Secondary | ICD-10-CM | POA: Diagnosis not present

## 2014-08-16 DIAGNOSIS — K769 Liver disease, unspecified: Secondary | ICD-10-CM | POA: Diagnosis present

## 2014-08-16 DIAGNOSIS — R160 Hepatomegaly, not elsewhere classified: Secondary | ICD-10-CM

## 2014-08-16 LAB — PROTIME-INR
INR: 1.06 (ref 0.00–1.49)
Prothrombin Time: 13.9 seconds (ref 11.6–15.2)

## 2014-08-16 LAB — CBC
HEMATOCRIT: 33.8 % — AB (ref 36.0–46.0)
Hemoglobin: 11.4 g/dL — ABNORMAL LOW (ref 12.0–15.0)
MCH: 30.8 pg (ref 26.0–34.0)
MCHC: 33.7 g/dL (ref 30.0–36.0)
MCV: 91.4 fL (ref 78.0–100.0)
Platelets: 227 10*3/uL (ref 150–400)
RBC: 3.7 MIL/uL — ABNORMAL LOW (ref 3.87–5.11)
RDW: 12.3 % (ref 11.5–15.5)
WBC: 5.8 10*3/uL (ref 4.0–10.5)

## 2014-08-16 LAB — APTT: APTT: 28 s (ref 24–37)

## 2014-08-16 MED ORDER — MIDAZOLAM HCL 2 MG/2ML IJ SOLN
INTRAMUSCULAR | Status: AC
  Filled 2014-08-16: qty 4

## 2014-08-16 MED ORDER — FENTANYL CITRATE 0.05 MG/ML IJ SOLN
INTRAMUSCULAR | Status: AC | PRN
  Administered 2014-08-16: 100 ug via INTRAVENOUS

## 2014-08-16 MED ORDER — FENTANYL CITRATE 0.05 MG/ML IJ SOLN
INTRAMUSCULAR | Status: AC
  Filled 2014-08-16: qty 4

## 2014-08-16 MED ORDER — MIDAZOLAM HCL 2 MG/2ML IJ SOLN
INTRAMUSCULAR | Status: AC | PRN
  Administered 2014-08-16: 2 mg via INTRAVENOUS
  Administered 2014-08-16: 1 mg via INTRAVENOUS

## 2014-08-16 MED ORDER — SODIUM CHLORIDE 0.9 % IV SOLN
INTRAVENOUS | Status: DC
  Administered 2014-08-16: 11:00:00 via INTRAVENOUS

## 2014-08-16 NOTE — Discharge Instructions (Signed)
Liver Biopsy, Care After Refer to this sheet in the next few weeks. These instructions provide you with information on caring for yourself after your procedure. Your health care provider may also give you more specific instructions. Your treatment has been planned according to current medical practices, but problems sometimes occur. Call your health care provider if you have any problems or questions after your procedure. WHAT TO EXPECT AFTER THE PROCEDURE After your procedure, it is typical to have the following:  A small amount of discomfort in the area where the biopsy was done and in the right shoulder or shoulder blade.  A small amount of bruising around the area where the biopsy was done and on the skin over the liver.  Sleepiness and fatigue for the rest of the day. HOME CARE INSTRUCTIONS   Rest at home for 1-2 days or as directed by your health care provider.  Have a friend or family member stay with you for at least 24 hours.  Because of the medicines used during the procedure, you should not do the following things in the first 24 hours:  Drive.  Use machinery.  Be responsible for the care of other people.  Sign legal documents.  Take a bath or shower.  There are many different ways to close and cover an incision, including stitches, skin glue, and adhesive strips. Follow your health care provider's instructions on:  Incision care.  Bandage (dressing) changes and removal.  Incision closure removal.  Do not drink alcohol in the first week.  Do not lift more than 5 pounds or play contact sports for 2 weeks after this test.  Take medicines only as directed by your health care provider. Do not take medicine containing aspirin or non-steroidal anti-inflammatory medicines such as ibuprofen for 1 week after this test.  It is your responsibility to get your test results. SEEK MEDICAL CARE IF:   You have increased bleeding from an incision that results in more than a  small spot of blood.  You have redness, swelling, or increasing pain in any incisions.  You notice a discharge or a bad smell coming from any of your incisions.  You have a fever or chills. SEEK IMMEDIATE MEDICAL CARE IF:   You develop swelling, bloating, or pain in your abdomen.  You become dizzy or faint.  You develop a rash.  You are nauseous or vomit.  You have difficulty breathing, feel short of breath, or feel faint.  You develop chest pain.  You have problems with your speech or vision.  You have trouble balancing or moving your arms or legs. Document Released: 05/04/2005 Document Revised: 03/01/2014 Document Reviewed: 12/11/2013 [facility] Patient Information 2015 [place], [place]. This information is not intended to replace advice given to you by your health care provider. Make sure you discuss any questions you have with your health care provider. Conscious Sedation, Adult, Care After Refer to this sheet in the next few weeks. These instructions provide you with information on caring for yourself after your procedure. Your health care provider may also give you more specific instructions. Your treatment has been planned according to current medical practices, but problems sometimes occur. Call your health care provider if you have any problems or questions after your procedure. WHAT TO EXPECT AFTER THE PROCEDURE  After your procedure:  You may feel sleepy, clumsy, and have poor balance for several hours.  Vomiting may occur if you eat too soon after the procedure. HOME CARE INSTRUCTIONS  Do not participate  in any activities where you could become injured for at least 24 hours. Do not:  Drive.  Swim.  Ride a bicycle.  Operate heavy machinery.  Cook.  Use power tools.  Climb ladders.  Work from a high place.  Do not make important decisions or sign legal documents until you are improved.  If you vomit, drink water, juice, or soup when you can drink without  vomiting. Make sure you have little or no nausea before eating solid foods.  Only take over-the-counter or prescription medicines for pain, discomfort, or fever as directed by your health care provider.  Make sure you and your family fully understand everything about the medicines given to you, including what side effects may occur.  You should not drink alcohol, take sleeping pills, or take medicines that cause drowsiness for at least 24 hours.  If you smoke, do not smoke without supervision.  If you are feeling better, you may resume normal activities 24 hours after you were sedated.  Keep all appointments with your health care provider. SEEK MEDICAL CARE IF:  Your skin is pale or bluish in color.  You continue to feel nauseous or vomit.  Your pain is getting worse and is not helped by medicine.  You have bleeding or swelling.  You are still sleepy or feeling clumsy after 24 hours. SEEK IMMEDIATE MEDICAL CARE IF:  You develop a rash.  You have difficulty breathing.  You develop any type of allergic problem.  You have a fever. MAKE SURE YOU:  Understand these instructions.  Will watch your condition.  Will get help right away if you are not doing well or get worse. Document Released: 08/05/2013 Document Reviewed: 08/05/2013 [facility] Patient Information 2015 [place], [place]. This information is not intended to replace advice given to you by your health care provider. Make sure you discuss any questions you have with your health care provider.

## 2014-08-16 NOTE — Procedures (Signed)
Procedure:  Ultrasound guided core biopsy of liver Findings:  Multiple hypoechoic lesions in liver.  Lesion in right lobe sampled via 17 G needle with 18 G core device x 4.  No complications.

## 2014-08-16 NOTE — H&P (Signed)
Patient seen.  For ultrasound guided biopsy of liver lesion today.  Discussed need for re-biopsy with patient and importance in her workup prior to treatment.  Patient understands and consents to biopsy procedure.

## 2014-08-16 NOTE — H&P (Signed)
Chief Complaint: "I'm here for a biopsy"  Referring Physician(s): Neijstrom,Eric S  History of Present Illness: Lisa Terrell is a 63 y.o. 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  Past Medical History  Diagnosis Date  . Hypertension   . Mass of submandibular region 10/02/13    TWO PALPABLE LEFT MASSES NOTED  . Mass of lung PER PET 10/29/13    LEFT UPPER LOBE  . Lung nodules 11/04/13 PER PET    MEDIAL LEFT UPPER LOBE  . Varicose veins   . Eczema     Past Surgical History  Procedure Laterality Date  . Video bronchoscopy with endobronchial navigation N/A 11/19/2013    Procedure: VIDEO BRONCHOSCOPY WITH ENDOBRONCHIAL NAVIGATION;  Surgeon: Melrose Nakayama, MD;  Location: [place];  Service: Thoracic;  Laterality: N/A;  . Endobronchial ultrasound N/A 11/19/2013    Procedure: ENDOBRONCHIAL ULTRASOUND;  Surgeon: Melrose Nakayama, MD;  Location: [place];  Service: Thoracic;  Laterality: N/A;  . Hammer toe surgery  2004    right  . Tubal ligation    . Mass excision N/A 12/01/2013    Procedure: EXCISION NECK MASS--SUBMANDIBULAR;  Surgeon: Ascencion Dike, MD;  Location: [place];  Service: ENT;  Laterality: N/A;  . Video assisted thoracoscopy (vats)/ lobectomy Left 01/07/2014    Procedure: VIDEO ASSISTED THORACOSCOPY (VATS)/ UPPER LOBECTOMY;  Surgeon: Melrose Nakayama, MD;  Location: [place];  Service: Thoracic;  Laterality: Left;  LEFT VATS/LUL LOBECTOMY  . Lymph node dissection Left 01/07/2014    Procedure: LYMPH NODE DISSECTION;  Surgeon: Melrose Nakayama, MD;  Location: [place];  Service: Thoracic;  Laterality: Left;    Allergies: Review of patient's allergies indicates no known allergies.  Medications: Prior to Admission medications   Medication Sig Start Date End Date Taking? Authorizing  Provider  ALOE VERA PO Take 1 capsule by mouth daily as needed (for constipation).    Yes Historical Provider, MD  cetaphil (CETAPHIL) lotion Apply 1 application topically daily as needed for dry skin (after shower).    Yes Historical Provider, MD  HYDROcodone-acetaminophen (NORCO/VICODIN) 5-325 MG per tablet Take 1 tablet every 4 to 6 hours as needed for pain. 02/19/14  Yes Historical Provider, MD  lisinopril-hydrochlorothiazide (PRINZIDE,ZESTORETIC) 20-25 MG per tablet Take 1 tablet by mouth daily.   Yes Historical Provider, MD  potassium chloride SA (K-DUR,KLOR-CON) 20 MEQ tablet Take 20 mEq by mouth. 03/31/14  Yes Historical Provider, MD    Family History  Problem Relation Age of Onset  . COPD Father   . Heart disease Father   . Hyperlipidemia Father   . Hypertension Father   . Tuberculosis Mother     History   Social History  . Marital Status: Single    Spouse Name: N/A    Number of Children: N/A  . Years of Education: N/A   Social History Main Topics  . Smoking status: Former Smoker -- 1.00 packs/day for 30 years    Types: Cigarettes    Quit date: 11/19/2013  . Smokeless tobacco: Never Used  . Alcohol Use: No     Comment: formerly (social)  . Drug Use: No  . Sexual Activity: None   Other Topics Concern  . None   Social History Narrative  . None         Review of Systems  Constitutional: Negative for fever and chills.  Respiratory: Negative for cough and shortness of breath.   Cardiovascular: Negative for chest pain.  Gastrointestinal: Negative for nausea, vomiting, abdominal pain and blood in stool.  Genitourinary: Negative for dysuria and hematuria.  Musculoskeletal: Negative for back pain.  Neurological: Negative for headaches.  Hematological: Does not bruise/bleed easily.    Vital Signs: BP 141/70  Pulse 63  Temp(Src) 98.4 F (36.9 C) (Oral)  Resp 16  SpO2 100%  Physical Exam  Constitutional: She is oriented to person, place, and time. She  appears well-developed and well-nourished.  Cardiovascular: Normal rate and regular rhythm.   Pulmonary/Chest: Effort normal and breath sounds normal.  Abdominal: Soft. Bowel sounds are normal. There is no tenderness.  Musculoskeletal: Normal range of motion. She exhibits no edema.  Neurological: She is alert and oriented to person, place, and time.    Imaging: No results found.  Labs:  CBC:  Recent Labs  01/08/14 0345 01/09/14 0420 01/10/14 0405 08/16/14 1115  WBC 7.0 6.6 7.7 5.8  HGB 10.3* 11.2* 10.5* 11.4*  HCT 30.3* 33.2* 31.3* 33.8*  PLT 170 172 173 227    COAGS:  Recent Labs  11/18/13 1127 01/05/14 1350 08/16/14 1115  INR 1.08 1.08 1.06  APTT 29 26 28     BMP:  Recent Labs  01/05/14 1350 01/08/14 0345 01/09/14 0420 01/10/14 0405  NA 140 143 141 139  K 4.1 3.6* 4.2 3.8  CL 106 107 106 101  CO2 21 27 26 27   GLUCOSE 82 117* 105* 108*  BUN 17 14 13 13   CALCIUM 9.3 8.0* 8.3* 8.5  CREATININE 0.88 0.90 0.90 0.95  GFRNONAA 69* 67* 67* 63*  GFRAA 80* 78* 78* 73*    LIVER FUNCTION TESTS:  Recent Labs  11/18/13 1127 01/05/14 1350 01/09/14 0420  BILITOT 0.5 0.2* 0.4  AST 24 20 32  ALT 18 19 20   ALKPHOS 66 73 52  PROT 7.1 7.2 5.8*  ALBUMIN 3.8 3.6 2.7*    TUMOR MARKERS: No results found for this basename: AFPTM, CEA, CA199, CHROMGRNA,  in the last 8760 hours  Assessment and Plan: Lisa Terrell is a 63 y.o. 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 Details/risks of procedure d/w pt/family with their understanding and consent.         Signed: Autumn Messing 08/16/2014, 12:34 PM

## 2014-12-21 ENCOUNTER — Encounter: Payer: 59 | Admitting: Thoracic Surgery (Cardiothoracic Vascular Surgery)

## 2014-12-21 ENCOUNTER — Telehealth: Payer: Self-pay | Admitting: *Deleted

## 2014-12-21 ENCOUNTER — Encounter: Payer: Self-pay | Admitting: Thoracic Surgery (Cardiothoracic Vascular Surgery)

## 2014-12-21 NOTE — Progress Notes (Signed)
This encounter was created in error - please disregard. This encounter was created in error - please disregard. This encounter was created in error - please disregard. 

## 2014-12-28 DEATH — deceased

## 2015-11-13 IMAGING — CR DG CHEST 2V
1 series · 1 of 1 positions shown · non-contrast
Comparison: 01/09/2014

CLINICAL DATA: Left chest surgery

EXAM:
CHEST  2 VIEW

[view not recorded]
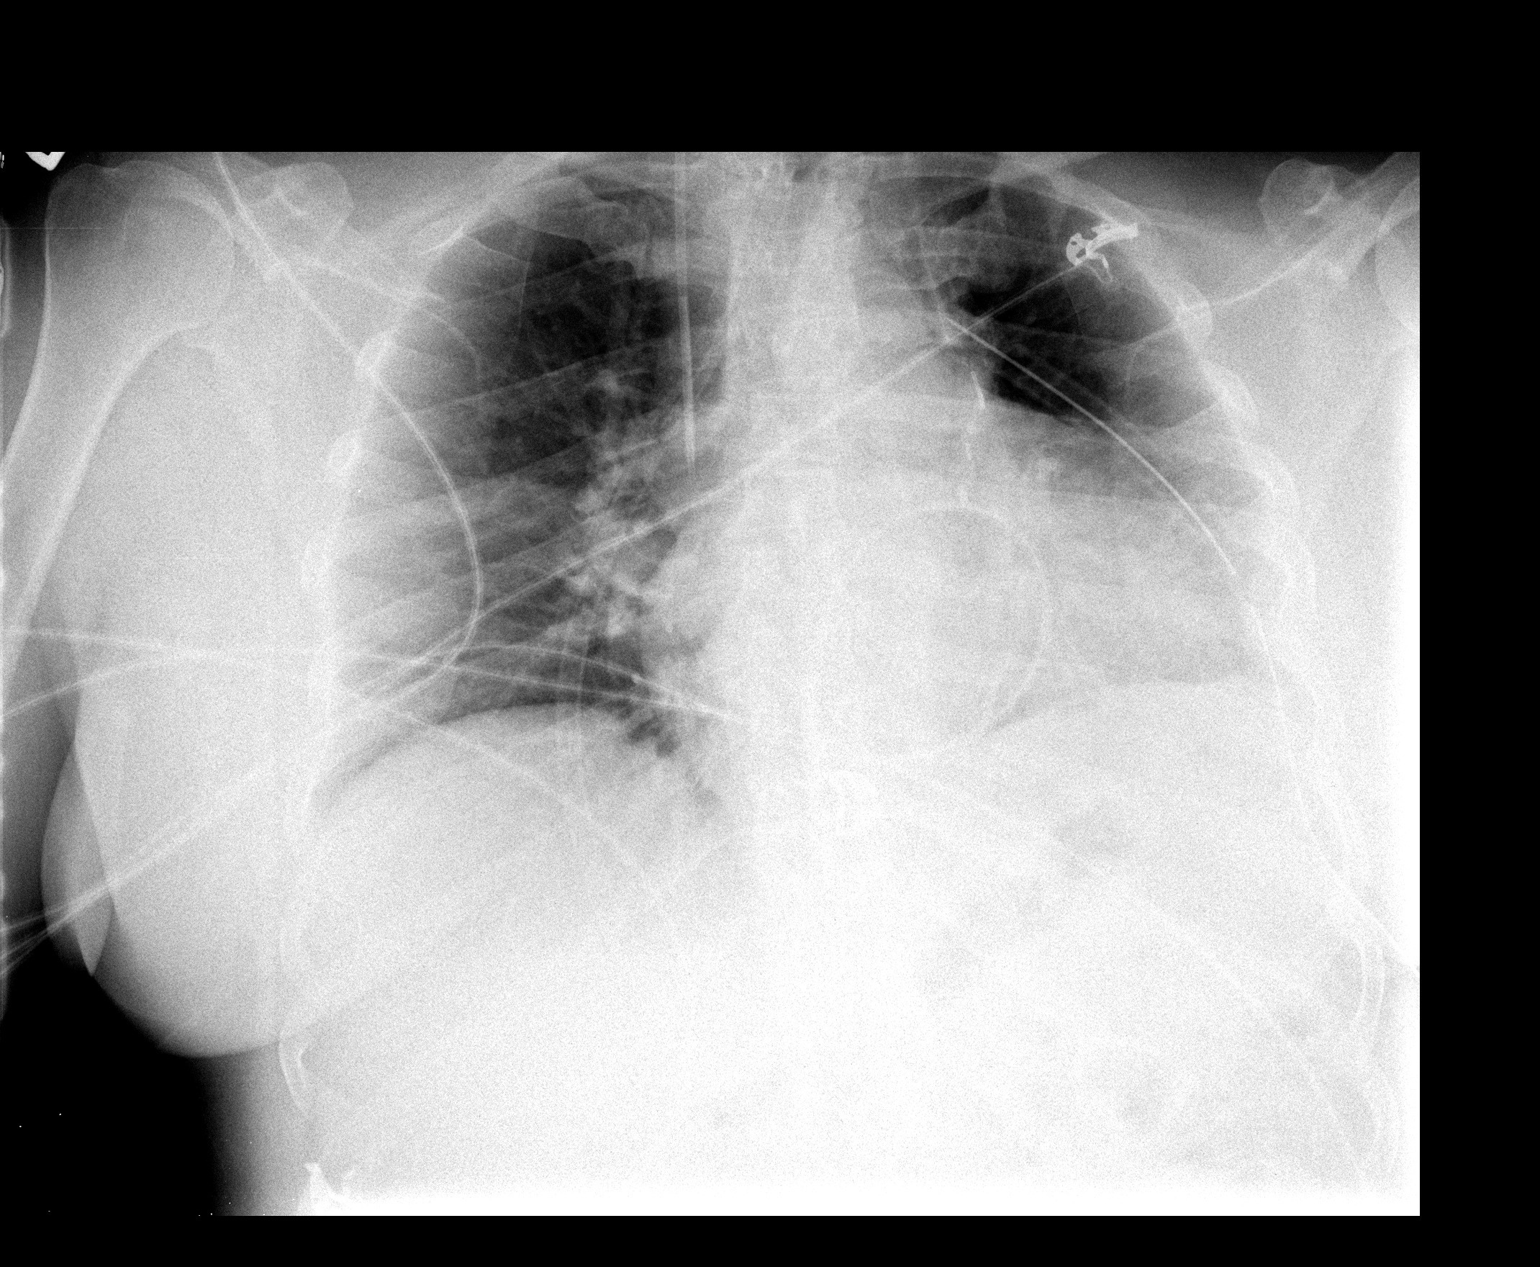

[1 of 1 positions shown; findings below may reference images not displayed]

FINDINGS: One of 2 chest tubes on the left is been removed. No pneumothorax.
Minimal left pleural effusion.

Cardiac enlargement without heart failure. Right jugular catheter
tip in the SVC.
IMPRESSION: No pneumothorax after removal of 1 of 2 left chest tubes. Minimal
left effusion is present.

## 2015-11-14 IMAGING — CR DG CHEST 1V SAME DAY
1 series · 1 of 1 positions shown · non-contrast
Comparison: 01/11/2014

CLINICAL DATA: Chest tube clamped.  Question pneumothorax.

EXAM:
CHEST - 1 VIEW SAME DAY

[AP]
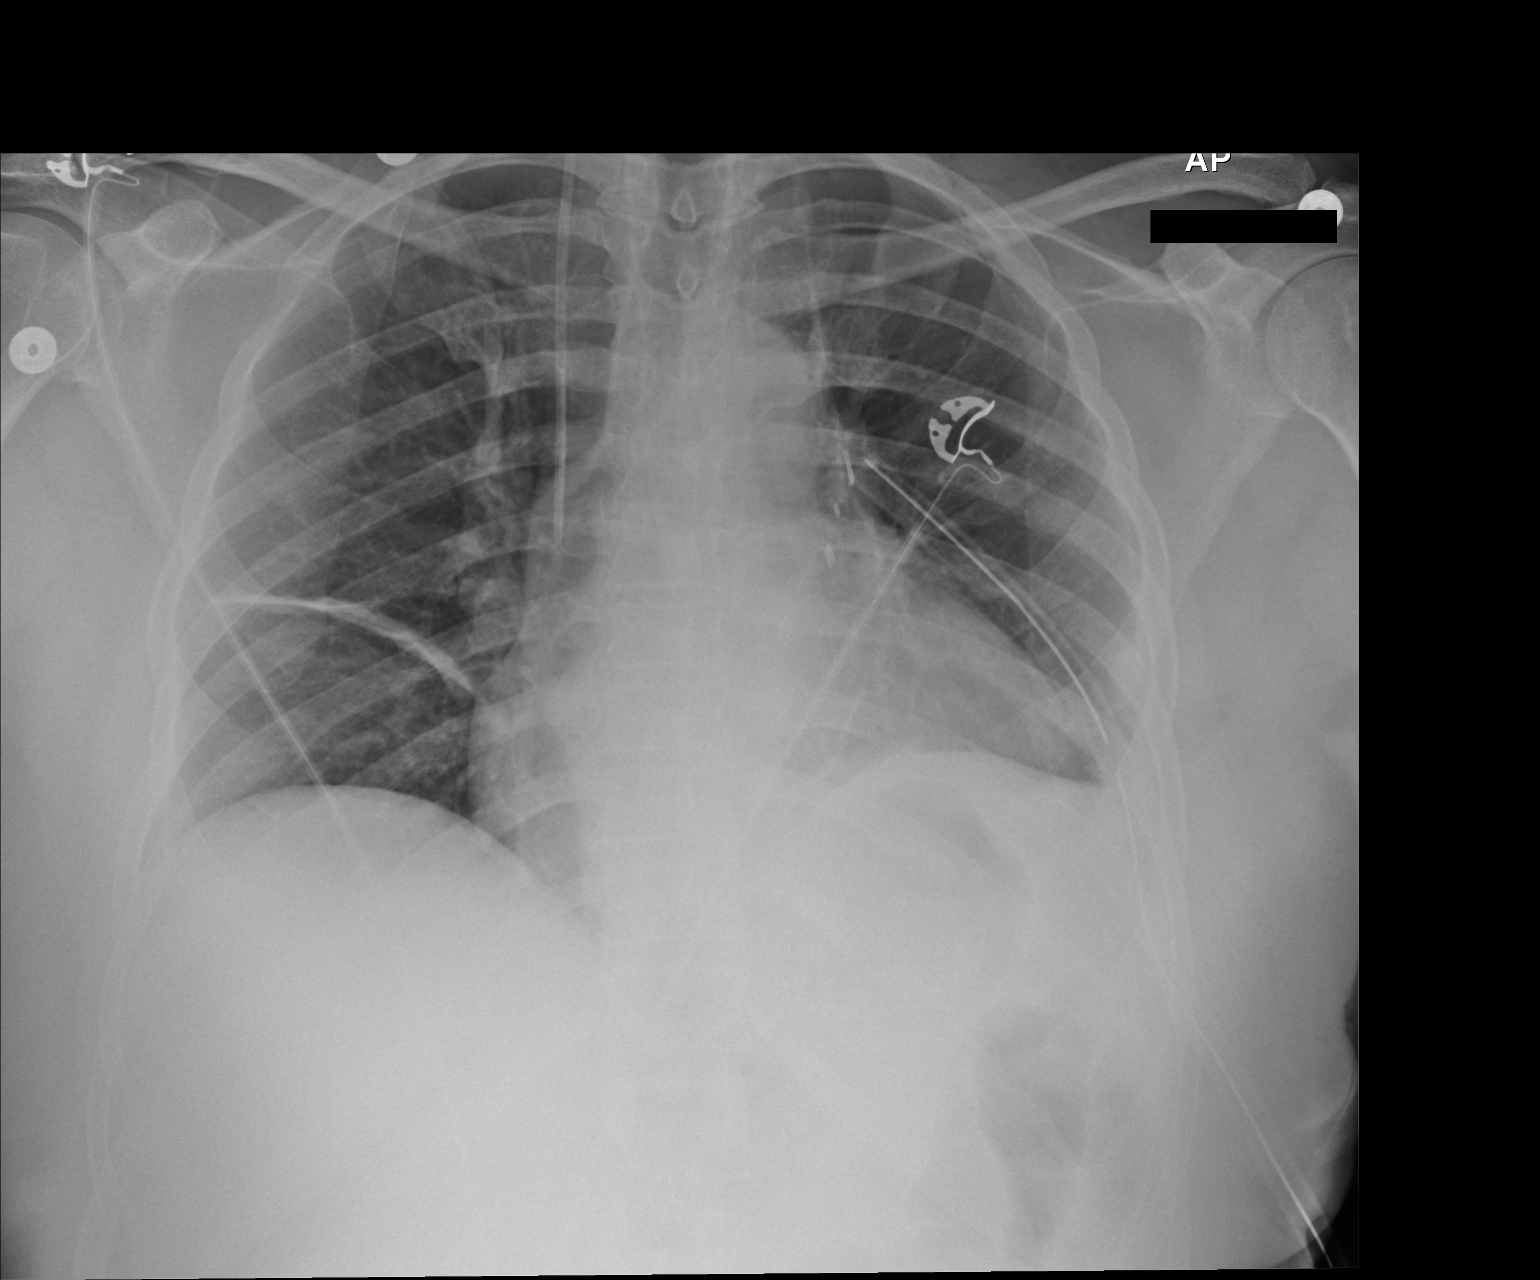

[1 of 1 positions shown; findings below may reference images not displayed]

FINDINGS: There is a small left-sided pneumothorax, approximately 5%. This is
new since prior study. Right mid lung atelectasis and left base
atelectasis, stable. Heart is enlarged. Right central line is
unchanged.
IMPRESSION: Small left-sided pneumothorax following chest tube clamping.

These results will be called to the ordering clinician or
representative by the Radiologist Assistant, and communication
documented in the PACS Dashboard.

## 2015-11-14 IMAGING — CR DG CHEST 2V
2 series · 2 of 2 positions shown · non-contrast
Comparison: Chest x-ray from yesterday

CLINICAL DATA: Vats

EXAM:
CHEST  2 VIEW

[w chest pa]
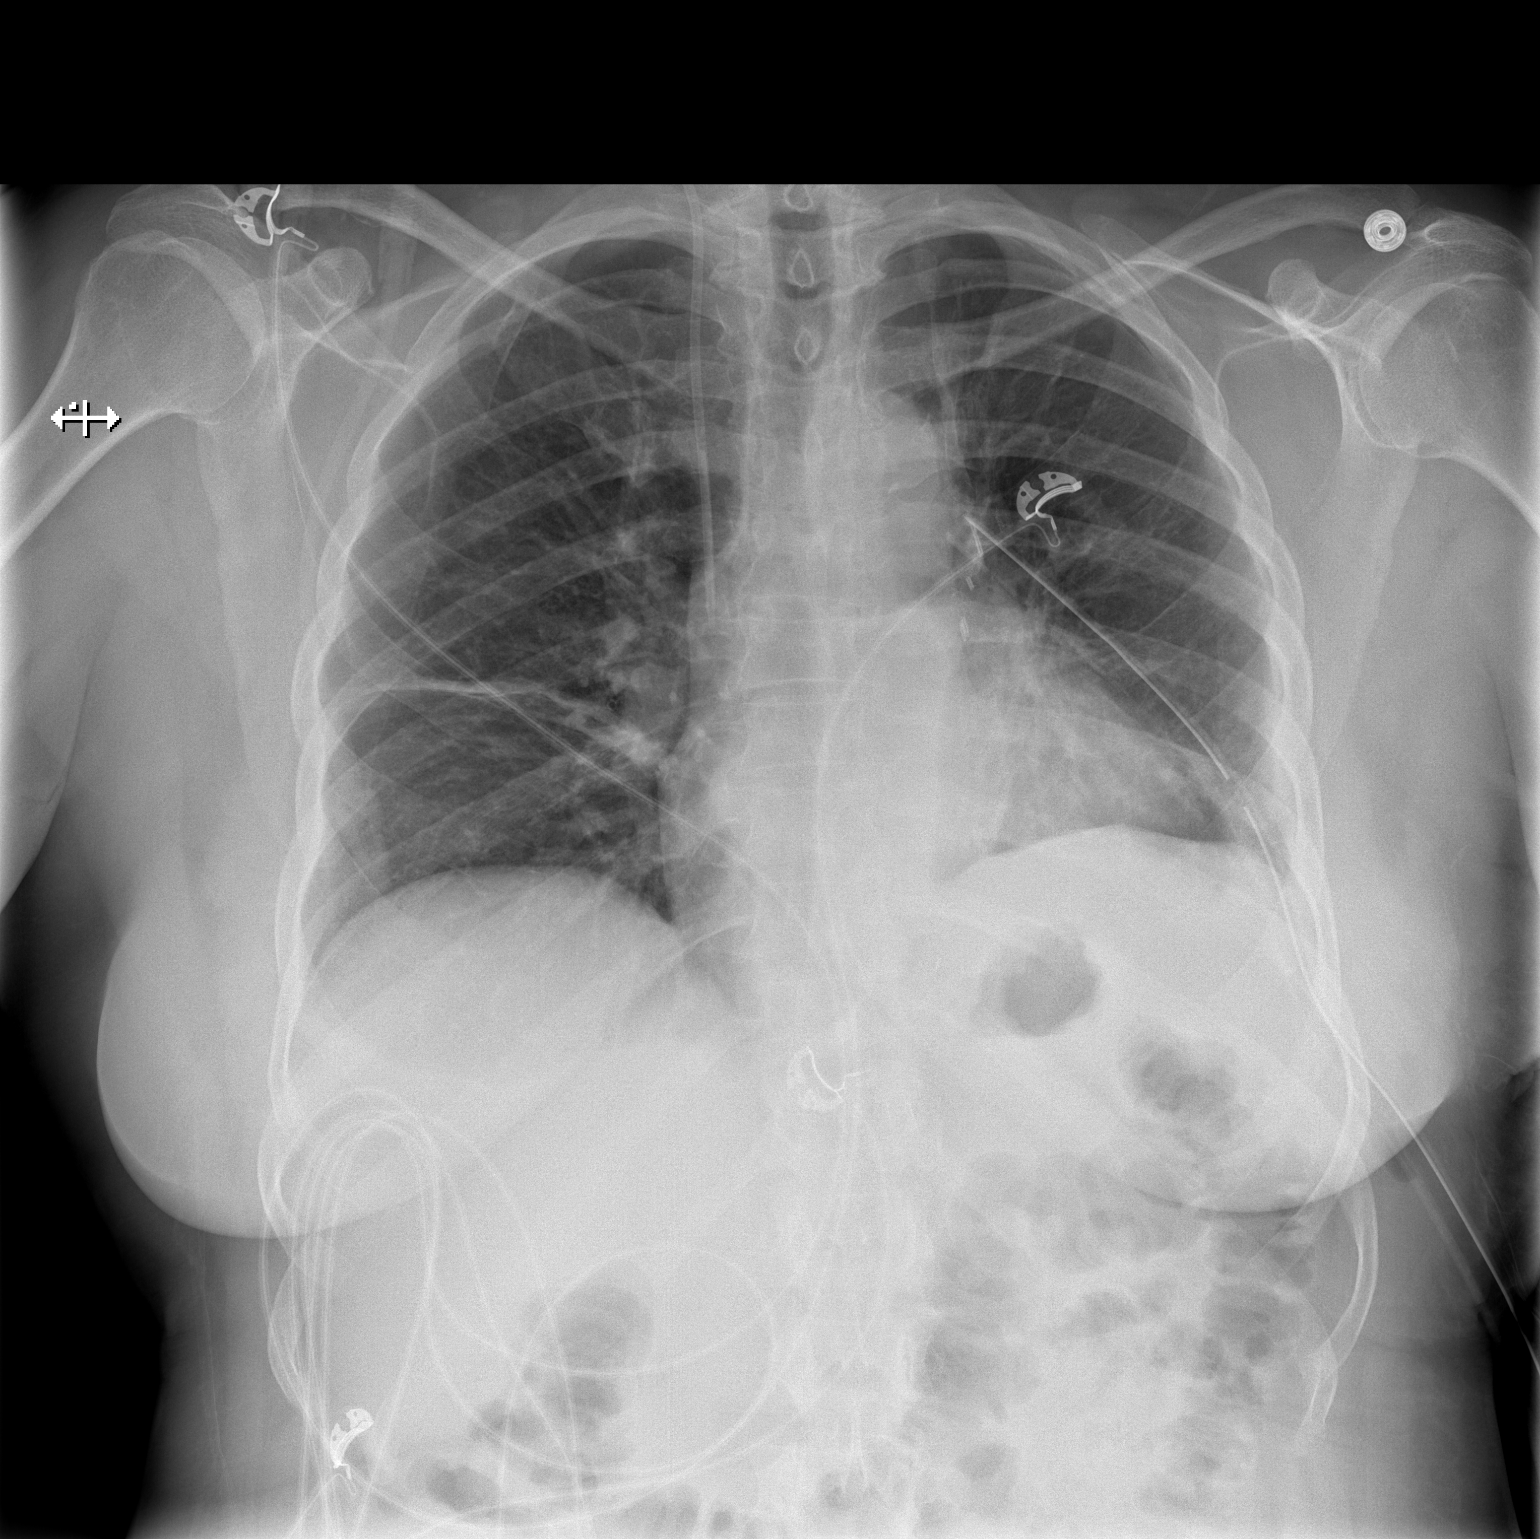

[w chest lat]
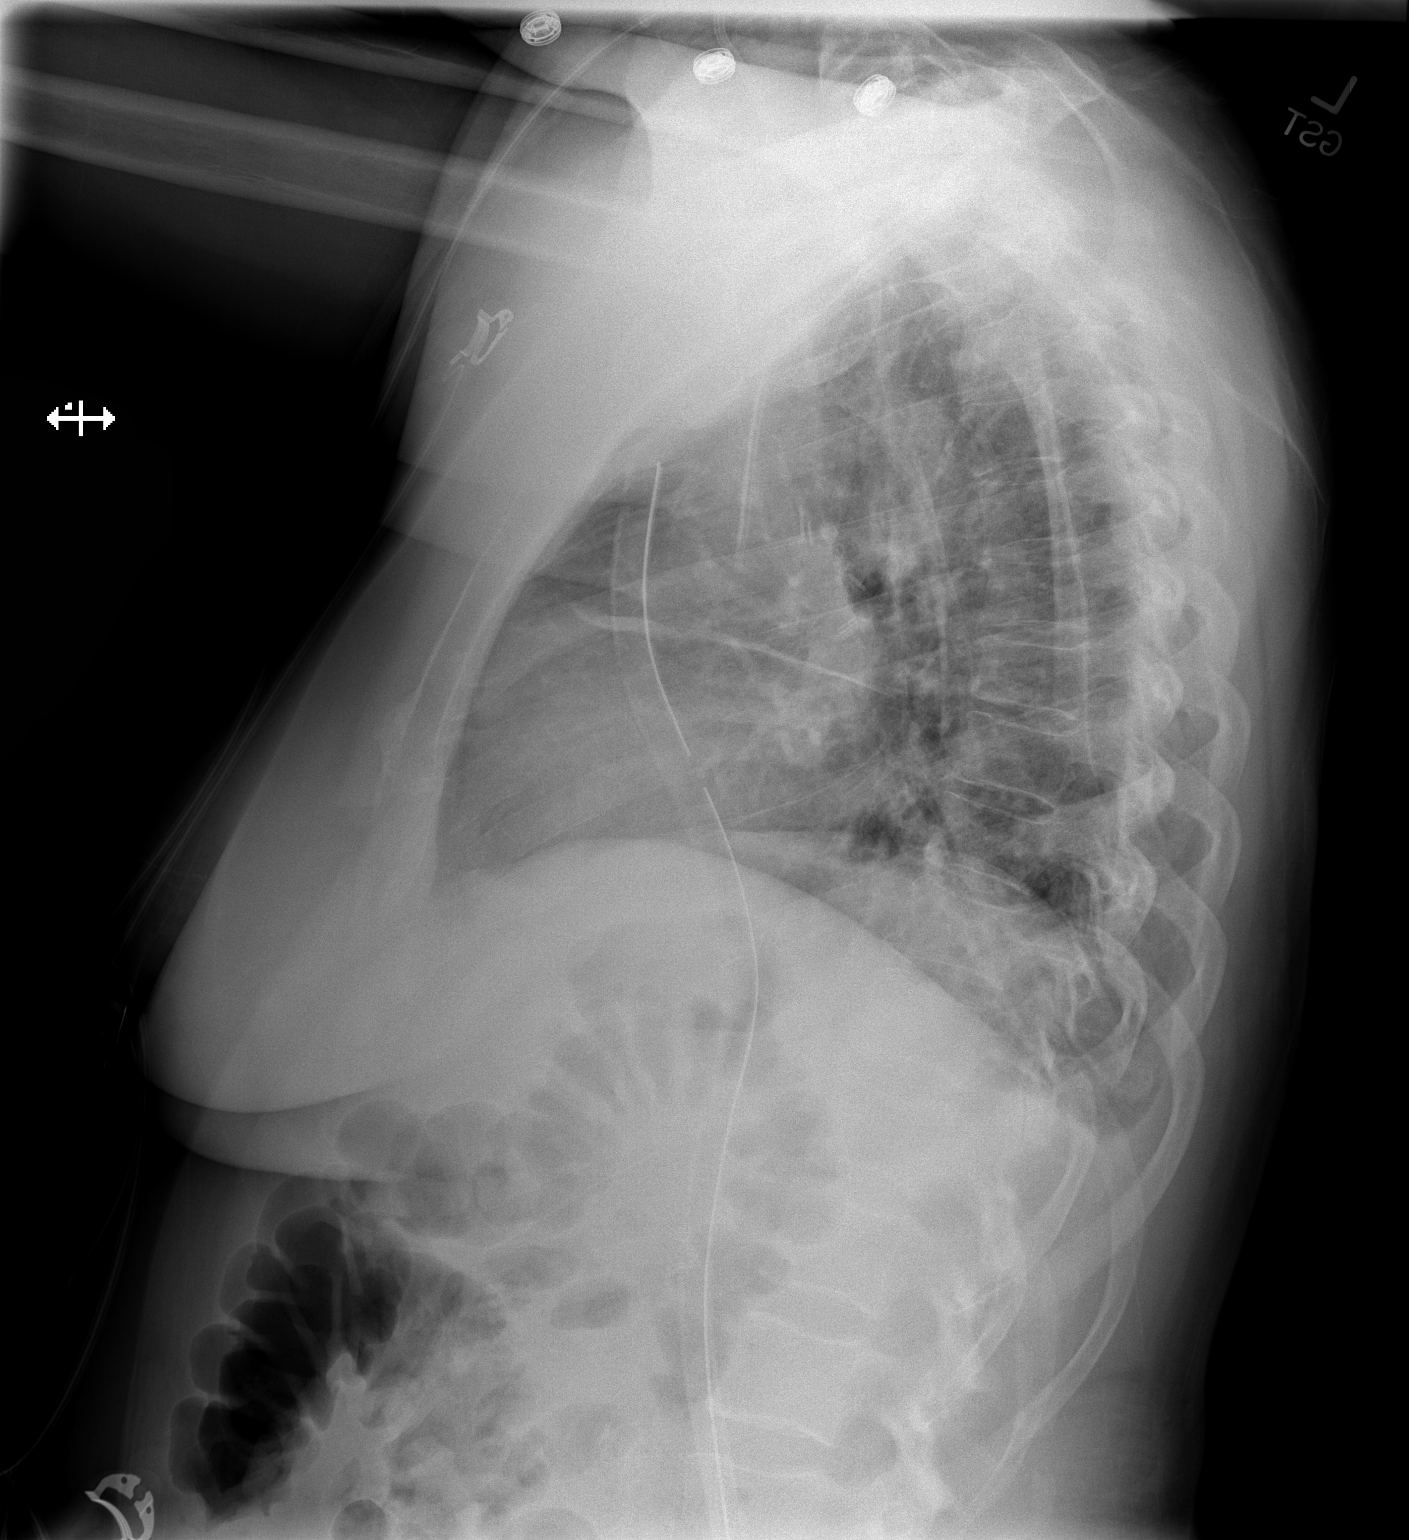

[2 of 2 positions shown; findings below may reference images not displayed]

FINDINGS: Right IJ catheter and left-sided chest tube are in similar position.

Normal heart size. Postoperative changes to the left hilum. There is
new subsegmental atelectasis in the right mid lung. There is small
left effusion. Trace left apical pneumothorax.
IMPRESSION: 1. Trace left hydro pneumothorax. Left-sided chest tube is in
unchanged position.
2. New, mild right-sided atelectasis.
